# Patient Record
Sex: Male | Born: 1965 | Race: Black or African American | Hispanic: No | Marital: Married | Smoking: Never smoker
Health system: Southern US, Community
[De-identification: ages and names within clinical notes are randomized; demographics above are authoritative.]

## PROBLEM LIST (undated history)

## (undated) DIAGNOSIS — K219 Gastro-esophageal reflux disease without esophagitis: Secondary | ICD-10-CM

## (undated) DIAGNOSIS — F32A Depression, unspecified: Secondary | ICD-10-CM

## (undated) DIAGNOSIS — I10 Essential (primary) hypertension: Secondary | ICD-10-CM

## (undated) DIAGNOSIS — F329 Major depressive disorder, single episode, unspecified: Secondary | ICD-10-CM

## (undated) DIAGNOSIS — G4710 Hypersomnia, unspecified: Secondary | ICD-10-CM

## (undated) DIAGNOSIS — T7840XA Allergy, unspecified, initial encounter: Secondary | ICD-10-CM

## (undated) DIAGNOSIS — N411 Chronic prostatitis: Secondary | ICD-10-CM

## (undated) DIAGNOSIS — G4730 Sleep apnea, unspecified: Secondary | ICD-10-CM

## (undated) DIAGNOSIS — L309 Dermatitis, unspecified: Secondary | ICD-10-CM

## (undated) DIAGNOSIS — N529 Male erectile dysfunction, unspecified: Secondary | ICD-10-CM

## (undated) DIAGNOSIS — K5790 Diverticulosis of intestine, part unspecified, without perforation or abscess without bleeding: Secondary | ICD-10-CM

## (undated) HISTORY — DX: Chronic prostatitis: N41.1

## (undated) HISTORY — DX: Depression, unspecified: F32.A

## (undated) HISTORY — DX: Male erectile dysfunction, unspecified: N52.9

## (undated) HISTORY — DX: Diverticulosis of intestine, part unspecified, without perforation or abscess without bleeding: K57.90

## (undated) HISTORY — DX: Gastro-esophageal reflux disease without esophagitis: K21.9

## (undated) HISTORY — DX: Allergy, unspecified, initial encounter: T78.40XA

## (undated) HISTORY — DX: Major depressive disorder, single episode, unspecified: F32.9

## (undated) HISTORY — DX: Sleep apnea, unspecified: G47.30

## (undated) HISTORY — DX: Essential (primary) hypertension: I10

## (undated) HISTORY — DX: Dermatitis, unspecified: L30.9

## (undated) HISTORY — DX: Hypersomnia, unspecified: G47.10

## (undated) HISTORY — PX: VASECTOMY: SHX75

---

## 1998-06-21 ENCOUNTER — Ambulatory Visit (HOSPITAL_COMMUNITY): Admission: RE | Admit: 1998-06-21 | Discharge: 1998-06-21 | Payer: Self-pay | Admitting: Family Medicine

## 1998-06-21 ENCOUNTER — Encounter: Payer: Self-pay | Admitting: Family Medicine

## 1998-08-04 ENCOUNTER — Encounter: Payer: Self-pay | Admitting: Family Medicine

## 1998-08-04 ENCOUNTER — Ambulatory Visit (HOSPITAL_COMMUNITY): Admission: RE | Admit: 1998-08-04 | Discharge: 1998-08-04 | Payer: Self-pay | Admitting: Family Medicine

## 1998-08-06 ENCOUNTER — Emergency Department (HOSPITAL_COMMUNITY): Admission: EM | Admit: 1998-08-06 | Discharge: 1998-08-06 | Payer: Self-pay | Admitting: Emergency Medicine

## 2000-05-06 ENCOUNTER — Inpatient Hospital Stay (HOSPITAL_COMMUNITY): Admission: AD | Admit: 2000-05-06 | Discharge: 2000-05-08 | Payer: Self-pay | Admitting: Family Medicine

## 2000-05-08 ENCOUNTER — Encounter: Payer: Self-pay | Admitting: Family Medicine

## 2001-12-30 ENCOUNTER — Ambulatory Visit (HOSPITAL_COMMUNITY): Admission: RE | Admit: 2001-12-30 | Discharge: 2001-12-30 | Payer: Self-pay | Admitting: *Deleted

## 2001-12-30 ENCOUNTER — Encounter: Payer: Self-pay | Admitting: *Deleted

## 2002-02-28 ENCOUNTER — Encounter: Payer: Self-pay | Admitting: Family Medicine

## 2002-02-28 ENCOUNTER — Ambulatory Visit: Admission: RE | Admit: 2002-02-28 | Discharge: 2002-02-28 | Payer: Self-pay | Admitting: Emergency Medicine

## 2002-09-08 HISTORY — PX: CIRCUMCISION: SUR203

## 2003-01-06 ENCOUNTER — Ambulatory Visit (HOSPITAL_BASED_OUTPATIENT_CLINIC_OR_DEPARTMENT_OTHER): Admission: RE | Admit: 2003-01-06 | Discharge: 2003-01-06 | Payer: Self-pay | Admitting: Urology

## 2003-05-08 ENCOUNTER — Ambulatory Visit (HOSPITAL_BASED_OUTPATIENT_CLINIC_OR_DEPARTMENT_OTHER): Admission: RE | Admit: 2003-05-08 | Discharge: 2003-05-08 | Payer: Self-pay | Admitting: Internal Medicine

## 2004-04-24 ENCOUNTER — Emergency Department (HOSPITAL_COMMUNITY): Admission: EM | Admit: 2004-04-24 | Discharge: 2004-04-24 | Payer: Self-pay | Admitting: Emergency Medicine

## 2004-05-15 ENCOUNTER — Ambulatory Visit (HOSPITAL_COMMUNITY): Admission: RE | Admit: 2004-05-15 | Discharge: 2004-05-15 | Payer: Self-pay | Admitting: Family Medicine

## 2004-11-25 ENCOUNTER — Observation Stay (HOSPITAL_COMMUNITY): Admission: RE | Admit: 2004-11-25 | Discharge: 2004-11-25 | Payer: Self-pay | Admitting: Surgery

## 2004-11-25 ENCOUNTER — Encounter (INDEPENDENT_AMBULATORY_CARE_PROVIDER_SITE_OTHER): Payer: Self-pay | Admitting: *Deleted

## 2005-07-09 ENCOUNTER — Encounter: Admission: RE | Admit: 2005-07-09 | Discharge: 2005-07-09 | Payer: Self-pay | Admitting: *Deleted

## 2009-08-22 ENCOUNTER — Encounter: Admission: RE | Admit: 2009-08-22 | Discharge: 2009-08-22 | Payer: Self-pay | Admitting: Family Medicine

## 2009-09-08 HISTORY — PX: CERVICAL FUSION: SHX112

## 2009-09-10 ENCOUNTER — Encounter: Admission: RE | Admit: 2009-09-10 | Discharge: 2009-09-10 | Payer: Self-pay | Admitting: Family Medicine

## 2009-09-17 ENCOUNTER — Ambulatory Visit (HOSPITAL_COMMUNITY): Admission: RE | Admit: 2009-09-17 | Discharge: 2009-09-18 | Payer: Self-pay | Admitting: Neurological Surgery

## 2009-10-22 ENCOUNTER — Encounter: Admission: RE | Admit: 2009-10-22 | Discharge: 2009-10-22 | Payer: Self-pay | Admitting: Neurological Surgery

## 2010-11-23 LAB — DIFFERENTIAL
Basophils Absolute: 0 10*3/uL (ref 0.0–0.1)
Basophils Relative: 0 % (ref 0–1)
Eosinophils Absolute: 0.1 10*3/uL (ref 0.0–0.7)
Eosinophils Relative: 2 % (ref 0–5)
Lymphocytes Relative: 33 % (ref 12–46)
Lymphs Abs: 2 10*3/uL (ref 0.7–4.0)
Monocytes Absolute: 0.5 10*3/uL (ref 0.1–1.0)
Monocytes Relative: 8 % (ref 3–12)
Neutro Abs: 3.5 10*3/uL (ref 1.7–7.7)
Neutrophils Relative %: 56 % (ref 43–77)

## 2010-11-23 LAB — BASIC METABOLIC PANEL
BUN: 12 mg/dL (ref 6–23)
CO2: 27 mEq/L (ref 19–32)
Calcium: 9.6 mg/dL (ref 8.4–10.5)
Chloride: 107 mEq/L (ref 96–112)
Creatinine, Ser: 1.07 mg/dL (ref 0.4–1.5)
GFR calc Af Amer: >60 mL/min (ref >60)
GFR calc non Af Amer: >60 mL/min (ref >60)
Glucose, Bld: 100 mg/dL — ABNORMAL HIGH (ref 70–99)
Potassium: 4 mEq/L (ref 3.5–5.1)
Sodium: 142 mEq/L (ref 135–145)

## 2010-11-23 LAB — PROTIME-INR
INR: 1.04 (ref 0.00–1.49)
Prothrombin Time: 13.5 seconds (ref 11.6–15.2)

## 2010-11-23 LAB — CBC
HCT: 46.2 % (ref 39.0–52.0)
Hemoglobin: 16.1 g/dL (ref 13.0–17.0)
MCHC: 35 g/dL (ref 30.0–36.0)
MCV: 88.4 fL (ref 78.0–100.0)
Platelets: 190 10*3/uL (ref 150–400)
RBC: 5.22 MIL/uL (ref 4.22–5.81)
RDW: 13 % (ref 11.5–15.5)
WBC: 6.2 10*3/uL (ref 4.0–10.5)

## 2010-11-23 LAB — APTT: aPTT: 27 seconds (ref 24–37)

## 2011-01-24 NOTE — Op Note (Signed)
NAME:  Ronnie Wood, Ronnie Wood                ACCOUNT NO.:  [id number]   MEDICAL RECORD NO.:  [id number]          PATIENT TYPE:  AMB   LOCATION:  DAY                          FACILITY:  [facility]   PHYSICIAN:  Sandria Bales. Ezzard Standing, M.D.  DATE OF BIRTH:  [birth year]   DATE OF PROCEDURE:  11/25/2004  DATE OF DISCHARGE:                                 OPERATIVE REPORT   PREOPERATIVE DIAGNOSIS:  Third-degree prolapsing hemorrhoids, mainly on the  right anterior and posterior bundles.   POSTOPERATIVE DIAGNOSIS:  Third-degree prolapsing hemorrhoids, sigmoid  diverticulosis.   PROCEDURE:  Flexible sigmoidoscopy to 60 cm and PPH stapling with 33-mm  Ethicon PPH stapler.   SURGEON:  Sandria Bales. Ezzard Standing, M.D.   FIRST ASSISTANT:  Vikki Ports, MD   ANESTHESIA:  General endotracheal in the prone position.   COMPLICATIONS:  None.   DESCRIPTION OF PROCEDURE:  Ronnie Wood is a 45 year old black male who I have  seen over four years for hemorrhoidal disease which has not gotten better  with medical management.  He now comes for attempted PPH stapling.  He has  loose stools which almost sounds like kind of an irritable bowel syndrome  problem.   The indications and potential complications of PPH stapling have been  explained to the patient.  The potential complications include, but are not  limited to, bleeding, infection, recurrence of the hemorrhoids, and  stricture formation.   He completed a Half-Lytely bowel prep at home.   Operative Note:  The patient was placed in a prone jackknife position with his buttocks taped  apart.  I passed a flexible Olympus sigmoidoscope to 60 cm.  He has fairly  significant sigmoid colon diverticulosis.  I do not see any masses, polyps,  or mucosal lesions.   The scope is withdrawn.  During the bowel prep, which the patient had  prepped his bowel with HalfLytely, he did have some edema of his hemorrhoids  today.  I injected the hemorrhoidal tissue with Wydase and  then used 30 cc  of 0.5% Marcaine with epinephrine in a circumanal fashion as a block.   I then used the silver anoscope and inserted this into the rectum.  I placed  a pursestring 2-0 Prolene suture in a circumferential pattern at about 5 cm  above the dentate line.  I then inserted the PPH stapler through this, tied  this down with three throws, and I then cinched down the [facility] stapler until  it was closed and the 4-cm mark was at the anal verge.   I then held pressure for one minute, fired the stapler, and removed the  stapler and the anal dilator.   I had a good 2+-cm ring of hemorrhoidal tissue in my staple.  I then  inspected the anus with the smaller anoscope in all four quadrants of the  rectum, and there was no new bleeding at any staple line, and we had good  retraction of his hemorrhoidal tissue.  The staple line is at 2 cm above the  dentate line.   I then took Nupercaine ointment-covered Gelfoam and packed  this into his  rectum.  I then sterilely dressed him.   We will keep him here for six to eight hours.  If he does well, he will go  home; otherwise, we will keep him overnight.  The patient tolerated the  procedure well.  He will need to be on some diet for his diverticulosis, and  I will discuss this with him when I see him back in the office.      DHN/MEDQ  D:  11/25/2004  T:  11/25/2004  Job:  [id number]   cc:   Deatra James, M.D.  Fax: [phone]

## 2011-01-24 NOTE — Op Note (Signed)
   NAME:  Ronnie Wood, Ronnie Wood                          ACCOUNT NO.:  [id number]   MEDICAL RECORD NO.:  [id number]                   PATIENT TYPE:  AMB   LOCATION:  NESC                                 FACILITY:  [facility]   PHYSICIAN:  Mark C. Vernie Ammons, M.D.               DATE OF BIRTH:  Feb 03, 1966   DATE OF PROCEDURE:  01/06/2003  DATE OF DISCHARGE:                                 OPERATIVE REPORT   PREOPERATIVE DIAGNOSIS:  Tethered frenulum and redundant foreskin.   POSTOPERATIVE DIAGNOSIS:  Tethered frenulum and redundant foreskin.   PROCEDURE:  Circumcision and release of tethered frenulum.   SURGEON:  Mark C. Vernie Ammons, M.D.   ANESTHESIA:  MAC with local.   ESTIMATED BLOOD LOSS:  Less than 5 mL.   SPECIMENS:  None.   DRAINS:  None.   COMPLICATIONS:  None.   INDICATIONS FOR PROCEDURE:  The patient is a 45 year old black male who has  been having discomfort when he has an erection and during intercourse due to  a band of tissue he describes at the bottom of the penis near the head. I  found on exam that he had a tethering frenulum. He would like that repaired  and understands the risks, complications and alternatives.   DESCRIPTION OF PROCEDURE:  After informed consent, the patient was brought  to the major OR, placed on the table, administered intravenous sedation. I  then performed a dorsal penile block in the standard fashion. Using a  combination 50% of 0.25% and 50% of 0.5% Marcaine. I then made a  circumcising incision circumferentially just proximal to the glans. At the  area of the frenulum, I noted the band of tissue causing his tethered  frenulum. This was incised and it released the frenulum. The redundant  foreskin was then excised by making a second incision on the shaft and  excising the redundant tissue. Bleeding points were cauterized. The frenulum  was then reapproximated in the midline with interrupted 3-0 Chromic sutures.  I then reapproximated the skin edges  with running 3-0 chromic. Neosporin and  a sterile occlusive dressing were applied. The patient was awakened and  taken to the recovery room in stable satisfactory condition. He will be  given a prescription for 40 Tylox and followup in my office in three weeks.                                                Mark C. Vernie Ammons, M.D.    MCO/MEDQ  D:  01/06/2003  T:  01/06/2003  Job:  [id number]
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
## 2012-12-27 ENCOUNTER — Other Ambulatory Visit: Payer: Self-pay | Admitting: Family Medicine

## 2013-05-13 ENCOUNTER — Ambulatory Visit (INDEPENDENT_AMBULATORY_CARE_PROVIDER_SITE_OTHER): Payer: BC Managed Care – PPO | Admitting: Physician Assistant

## 2013-05-13 VITALS — BP 105/60 | HR 54 | Temp 98.4°F | Resp 16 | Ht 67.5 in | Wt 173.0 lb

## 2013-05-13 DIAGNOSIS — J309 Allergic rhinitis, unspecified: Secondary | ICD-10-CM

## 2013-05-13 DIAGNOSIS — Z0000 Encounter for general adult medical examination without abnormal findings: Secondary | ICD-10-CM

## 2013-05-13 DIAGNOSIS — I10 Essential (primary) hypertension: Secondary | ICD-10-CM

## 2013-05-13 DIAGNOSIS — Z125 Encounter for screening for malignant neoplasm of prostate: Secondary | ICD-10-CM

## 2013-05-13 DIAGNOSIS — R209 Unspecified disturbances of skin sensation: Secondary | ICD-10-CM

## 2013-05-13 DIAGNOSIS — G4733 Obstructive sleep apnea (adult) (pediatric): Secondary | ICD-10-CM

## 2013-05-13 DIAGNOSIS — Z1211 Encounter for screening for malignant neoplasm of colon: Secondary | ICD-10-CM

## 2013-05-13 DIAGNOSIS — Z23 Encounter for immunization: Secondary | ICD-10-CM

## 2013-05-13 DIAGNOSIS — R202 Paresthesia of skin: Secondary | ICD-10-CM

## 2013-05-13 LAB — POCT URINALYSIS DIPSTICK
Bilirubin, UA: NEGATIVE
Glucose, UA: NEGATIVE
Ketones, UA: NEGATIVE
Leukocytes, UA: NEGATIVE
Nitrite, UA: NEGATIVE
Protein, UA: NEGATIVE
Spec Grav, UA: 1.025
Urobilinogen, UA: 0.2
pH, UA: 5.5

## 2013-05-13 LAB — COMPREHENSIVE METABOLIC PANEL
ALT: 21 U/L (ref 0–53)
AST: 23 U/L (ref 0–37)
Albumin: 4.4 g/dL (ref 3.5–5.2)
Alkaline Phosphatase: 47 U/L (ref 39–117)
BUN: 17 mg/dL (ref 6–23)
CO2: 30 mEq/L (ref 19–32)
Calcium: 9.6 mg/dL (ref 8.4–10.5)
Chloride: 101 mEq/L (ref 96–112)
Creat: 1.02 mg/dL (ref 0.50–1.35)
Glucose, Bld: 86 mg/dL (ref 70–99)
Potassium: 4 mEq/L (ref 3.5–5.3)
Sodium: 138 mEq/L (ref 135–145)
Total Bilirubin: 0.7 mg/dL (ref 0.3–1.2)
Total Protein: 7.3 g/dL (ref 6.0–8.3)

## 2013-05-13 LAB — POCT CBC
Granulocyte percent: 56 %G (ref 37–80)
HCT, POC: 46.2 % (ref 43.5–53.7)
Hemoglobin: 14.9 g/dL (ref 14.1–18.1)
Lymph, poc: 2.2 (ref 0.6–3.4)
MCH, POC: 30 pg (ref 27–31.2)
MCHC: 32.3 g/dL (ref 31.8–35.4)
MCV: 92.9 fL (ref 80–97)
MID (cbc): 0.5 (ref 0–0.9)
MPV: 8.6 fL (ref 0–99.8)
POC Granulocyte: 3.4 (ref 2–6.9)
POC LYMPH PERCENT: 36.3 %L (ref 10–50)
POC MID %: 7.7 %M (ref 0–12)
Platelet Count, POC: 211 10*3/uL (ref 142–424)
RBC: 4.97 M/uL (ref 4.69–6.13)
RDW, POC: 13.7 %
WBC: 6.1 10*3/uL (ref 4.6–10.2)

## 2013-05-13 LAB — POCT UA - MICROSCOPIC ONLY
Bacteria, U Microscopic: NEGATIVE
Casts, Ur, LPF, POC: NEGATIVE
Crystals, Ur, HPF, POC: NEGATIVE
Epithelial cells, urine per micros: NEGATIVE
Mucus, UA: NEGATIVE
RBC, urine, microscopic: NEGATIVE
WBC, Ur, HPF, POC: NEGATIVE
Yeast, UA: NEGATIVE

## 2013-05-13 LAB — LIPID PANEL
Cholesterol: 195 mg/dL (ref 0–200)
HDL: 34 mg/dL — ABNORMAL LOW (ref >39)
LDL Cholesterol: 120 mg/dL — ABNORMAL HIGH (ref 0–99)
Total CHOL/HDL Ratio: 5.7 Ratio
Triglycerides: 205 mg/dL — ABNORMAL HIGH (ref <150)
VLDL: 41 mg/dL — ABNORMAL HIGH (ref 0–40)

## 2013-05-13 LAB — TSH: TSH: 2.098 u[IU]/mL (ref 0.350–4.500)

## 2013-05-13 LAB — IFOBT (OCCULT BLOOD): IFOBT: NEGATIVE

## 2013-05-13 LAB — PSA: PSA: 1.79 ng/mL (ref <=4.00)

## 2013-05-13 NOTE — Progress Notes (Signed)
Subjective:    Patient ID: Ronnie Wood, male    DOB: 1966-02-28, 47 y.o.   MRN: [id number]  HPI This 47 y.o. male presents for Annual Wellness Exam.  PCP is Dr. Deatra James at [facility].  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  PMH: HTN, allergic rhinitis. S/P vasectomy, circumcision, fusion of C3-4 and C5-6.  MEDS: amlodipine 10 mg daily, fosinopril 10 mg daily  ALL: Sulfa antibiotics  History   Social History  . Marital Status: Single    Spouse Name: n/a    Number of Children: 2  . Years of Education: college   Occupational History  . 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 basketball official    Social History Main Topics  . Smoking status: Never Smoker   . Smokeless tobacco: Never Used  . Alcohol Use: 2.5 oz/week    5 drink(s) per week  . Drug Use: No  . Sexual Activity: Yes    Partners: Female    Birth Control/ Protection: Condom   Social History Narrative   Lives alone.  Divorced.  His older daughter graduated from [employer] of Therapist, sports and is currently Futures trader at [employer]. His younger daughter is a Biochemist, clinical and track star (200 m, 400 m) at [employer]. The patient runs about 30 minutes 4 days/week.       Review of Systems  Constitutional: Positive for fatigue (due to his work (3rd shift) and basketball officiating; feels well rested upon waking ). Negative for fever, chills, diaphoresis, activity change, appetite change and unexpected weight change.  HENT: Positive for congestion, neck stiffness ("sometimes, when I sleep funny") and sinus pressure. Negative for hearing loss, ear pain, nosebleeds, sore throat, facial swelling, rhinorrhea, sneezing, drooling, mouth sores, trouble swallowing, neck pain, dental problem, voice change, postnasal drip, tinnitus and ear discharge.   Eyes: Positive for itching. Negative for photophobia, pain, discharge, redness and visual  disturbance.  Respiratory: Positive for apnea (OSA, uses CPAP). Negative for cough, choking, chest tightness, shortness of breath, wheezing and stridor.   Cardiovascular: Negative.   Gastrointestinal: Positive for constipation (when doesn't drink enough water during the day). Negative for nausea, vomiting, abdominal pain, diarrhea, blood in stool, abdominal distention, anal bleeding and rectal pain.  Endocrine: Negative.   Genitourinary: Negative.   Musculoskeletal: Negative for myalgias, back pain, joint swelling, arthralgias and gait problem.  Skin: Negative.   Allergic/Immunologic: Positive for environmental allergies. Negative for food allergies and immunocompromised state.  Neurological: Positive for numbness (bilateral index and middle fingertips, R>L). Negative for dizziness, tremors, seizures, syncope, facial asymmetry, speech difficulty, weakness, light-headedness and headaches.  Hematological: Negative.   Psychiatric/Behavioral: Negative.        Objective:   Physical Exam  Vitals reviewed. Constitutional: He is oriented to person, place, and time. Vital signs are normal. He appears well-developed and well-nourished. He is active and cooperative.  Non-toxic appearance. He does not have a sickly appearance. He does not appear ill. No distress.  HENT:  Head: Normocephalic and atraumatic.  Right Ear: Hearing, tympanic membrane, external ear and ear canal normal.  Left Ear: Hearing, tympanic membrane, external ear and ear canal normal.  Nose: Nose normal.  Mouth/Throat: Uvula is midline, oropharynx is clear and moist and mucous membranes are normal. He does not have dentures. No oral lesions. No trismus in the jaw. Normal dentition. No dental abscesses, edematous, lacerations or dental caries.  Eyes:  Conjunctivae, EOM and lids are normal. Pupils are equal, round, and reactive to light. Right eye exhibits no discharge. Left eye exhibits no discharge. No scleral icterus.  Fundoscopic  exam:      The right eye shows no arteriolar narrowing, no AV nicking, no exudate, no hemorrhage and no papilledema.       The left eye shows no arteriolar narrowing, no AV nicking, no exudate, no hemorrhage and no papilledema.  Neck: Normal range of motion, full passive range of motion without pain and phonation normal. Neck supple. No spinous process tenderness and no muscular tenderness present. No rigidity. No tracheal deviation, no edema, no erythema and normal range of motion present. No thyromegaly present.  Cardiovascular: Normal rate, regular rhythm, S1 normal, S2 normal, normal heart sounds, intact distal pulses and normal pulses.  Exam reveals no gallop and no friction rub.   No murmur heard. Pulmonary/Chest: Effort normal and breath sounds normal. No respiratory distress. He has no wheezes. He has no rales.  Abdominal: Soft. Normal appearance and bowel sounds are normal. He exhibits no distension and no mass. There is no hepatosplenomegaly. There is no tenderness. There is no rebound and no guarding. No hernia. Hernia confirmed negative in the right inguinal area and confirmed negative in the left inguinal area.  Genitourinary: Rectum normal, prostate normal, testes normal and penis normal. Guaiac negative stool. Circumcised. No phimosis, paraphimosis, hypospadias, penile erythema or penile tenderness. No discharge found.  Hypopigmentation of the glans penis  Musculoskeletal: Normal range of motion. He exhibits no edema and no tenderness.       Right shoulder: Normal.       Left shoulder: Normal.       Right elbow: Normal.      Left elbow: Normal.       Right wrist: Normal.       Left wrist: Normal.       Right hip: Normal.       Left hip: Normal.       Right knee: Normal.       Left knee: Normal.       Right ankle: Normal. Achilles tendon normal.       Left ankle: Normal. Achilles tendon normal.       Cervical back: Normal. He exhibits normal range of motion, no tenderness, no  bony tenderness, no swelling, no edema, no deformity, no laceration, no pain, no spasm and normal pulse.       Thoracic back: Normal.       Lumbar back: Normal.       Right upper arm: Normal.       Left upper arm: Normal.       Right forearm: Normal.       Left forearm: Normal.       Right hand: Normal.       Left hand: Normal.       Right upper leg: Normal.       Left upper leg: Normal.       Right lower leg: Normal.       Left lower leg: Normal.       Right foot: Normal.       Left foot: Normal.  Lymphadenopathy:       Head (right side): No submental, no submandibular, no tonsillar, no preauricular, no posterior auricular and no occipital adenopathy present.       Head (left side): No submental, no submandibular, no tonsillar, no preauricular, no posterior auricular and no occipital adenopathy  present.    He has no cervical adenopathy.       Right: No inguinal and no supraclavicular adenopathy present.       Left: No inguinal and no supraclavicular adenopathy present.  Neurological: He is alert and oriented to person, place, and time. He has normal strength and normal reflexes. He displays no tremor. No cranial nerve deficit or sensory deficit. He exhibits normal muscle tone. Coordination and gait normal.  Tingling of the index and middle fingers worsened by Phalen's, not Tinel's.  Skin: Skin is warm, dry and intact. No abrasion, no ecchymosis, no laceration, no lesion and no rash noted. He is not diaphoretic. No cyanosis or erythema. No pallor. Nails show no clubbing.  Psychiatric: He has a normal mood and affect. His speech is normal and behavior is normal. Judgment and thought content normal. Cognition and memory are normal.      Results for orders placed in visit on 05/13/13  IFOBT (OCCULT BLOOD)      Result Value Range   IFOBT Negative    POCT CBC      Result Value Range   WBC 6.1  4.6 - 10.2 K/uL   Lymph, poc 2.2  0.6 - 3.4   POC LYMPH PERCENT 36.3  10 - 50 %L   MID  (cbc) 0.5  0 - 0.9   POC MID % 7.7  0 - 12 %M   POC Granulocyte 3.4  2 - 6.9   Granulocyte percent 56.0  37 - 80 %G   RBC 4.97  4.69 - 6.13 M/uL   Hemoglobin 14.9  14.1 - 18.1 g/dL   HCT, POC 91.4  78.2 - 53.7 %   MCV 92.9  80 - 97 fL   MCH, POC 30.0  27 - 31.2 pg   MCHC 32.3  31.8 - 35.4 g/dL   RDW, POC 95.6     Platelet Count, POC 211  142 - 424 K/uL   MPV 8.6  0 - 99.8 fL  POCT UA - MICROSCOPIC ONLY      Result Value Range   WBC, Ur, HPF, POC neg     RBC, urine, microscopic neg     Bacteria, U Microscopic neg     Mucus, UA neg     Epithelial cells, urine per micros neg     Crystals, Ur, HPF, POC neg     Casts, Ur, LPF, POC neg     Yeast, UA neg    POCT URINALYSIS DIPSTICK      Result Value Range   Color, UA yellow     Clarity, UA clear     Glucose, UA neg     Bilirubin, UA neg     Ketones, UA neg     Spec Grav, UA 1.025     Blood, UA trace-intact     pH, UA 5.5     Protein, UA neg     Urobilinogen, UA 0.2     Nitrite, UA neg     Leukocytes, UA Negative         Assessment & Plan:  Routine general medical examination at a health care facility - Age appropriate anticipatory guidance provided.  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  Trial with bilateral wrist splints.  F/U here or with PCP in 4-6 weeks.  If no improvement, consider NCS.  Allergic rhinitis - not interested in treatment at this time.  OSA (obstructive sleep apnea) - continue CPAP  Screening for prostate cancer - Plan: PSA  Screening for colon cancer - Plan: IFOBT POC (occult bld, rslt in office)  Need for influenza vaccination - Plan: Flu Vaccine QUAD 36+ mos IM  Fernande Bras, PA-C Physician Assistant-Certified [facility]

## 2013-05-13 NOTE — Patient Instructions (Addendum)
I will contact you with your lab results as soon as they are available.   If you have not heard from me in 2 weeks, please contact me.  The fastest way to get your results is to register for My Chart (see the instructions on the last page of this printout).  Wear the wrist splints as much as you can.  If they interfere at work, you can just wear them when you sleep. Follow-up here, or with Dr. Wynelle Link, in 4-6 weeks if the numbness/tingling in your fingers persists. You may need nerve conduction studies to determine the source of the problem.  Keeping you healthy  Get these tests  Blood pressure- Have your blood pressure checked once a year by your healthcare provider.  Normal blood pressure is 120/80.  Weight- Have your body mass index (BMI) calculated to screen for obesity.  BMI is a measure of body fat based on height and weight. You can also calculate your own BMI at https://www.west-esparza.com/.  Cholesterol- Have your cholesterol checked regularly starting at age 80, sooner may be necessary if you have diabetes, high blood pressure, if a family member developed heart diseases at an early age or if you smoke.   Chlamydia, HIV, and other sexual transmitted disease- Get screened each year until the age of 62 then within three months of each new sexual partner.  Diabetes- Have your blood sugar checked regularly if you have high blood pressure, high cholesterol, a family history of diabetes or if you are overweight.  Get these vaccines  Flu shot- Every fall.  Tetanus shot- Every 10 years.  Menactra- Single dose; prevents meningitis.  Take these steps  Don't smoke- If you do smoke, ask your healthcare provider about quitting. For tips on how to quit, go to www.smokefree.gov or call 1-800-QUIT-NOW.  Be physically active- Exercise 5 days a week for at least 30 minutes.  If you are not already physically active start slow and gradually work up to 30 minutes of moderate physical activity.   Examples of moderate activity include walking briskly, mowing the yard, dancing, swimming bicycling, etc.  Eat a healthy diet- Eat a variety of healthy foods such as fruits, vegetables, low fat milk, low fat cheese, yogurt, lean meats, poultry, fish, beans, tofu, etc.  For more information on healthy eating, go to www.thenutritionsource.org  Drink alcohol in moderation- Limit alcohol intake two drinks or less a day.  Never drink and drive.  Dentist- Brush and floss teeth twice daily; visit your dentis twice a year.  Depression-Your emotional health is as important as your physical health.  If you're feeling down, losing interest in things you normally enjoy please talk with your healthcare provider.  Gun Safety- If you keep a gun in your home, keep it unloaded and with the safety lock on.  Bullets should be stored separately.  Helmet use- Always wear a helmet when riding a motorcycle, bicycle, rollerblading or skateboarding.  Safe sex- If you may be exposed to a sexually transmitted infection, use a condom  Seat belts- Seat bels can save your life; always wear one.  Smoke/Carbon Monoxide detectors- These detectors need to be installed on the appropriate level of your home.  Replace batteries at least once a year.  Skin Cancer- When out in the sun, cover up and use sunscreen SPF 15 or higher.  Violence- If anyone is threatening or hurting you, please tell your healthcare provider.

## 2013-05-14 ENCOUNTER — Encounter: Payer: Self-pay | Admitting: Physician Assistant

## 2013-10-10 ENCOUNTER — Ambulatory Visit (INDEPENDENT_AMBULATORY_CARE_PROVIDER_SITE_OTHER): Payer: BC Managed Care – PPO | Admitting: Neurology

## 2013-10-10 ENCOUNTER — Telehealth: Payer: Self-pay | Admitting: *Deleted

## 2013-10-10 ENCOUNTER — Encounter: Payer: Self-pay | Admitting: Neurology

## 2013-10-10 VITALS — BP 120/78 | HR 68 | Temp 98.2°F | Ht 68.0 in | Wt 172.0 lb

## 2013-10-10 DIAGNOSIS — R42 Dizziness and giddiness: Secondary | ICD-10-CM

## 2013-10-10 DIAGNOSIS — H8110 Benign paroxysmal vertigo, unspecified ear: Secondary | ICD-10-CM

## 2013-10-10 NOTE — Telephone Encounter (Signed)
Patient is aware  appt for eeg at [facility]  10/14/13 at 10:45

## 2013-10-10 NOTE — Patient Instructions (Addendum)
At this point, I think the episodes may be due to low blood sugar when you don't eat.  To be sure, we will check an EEG of the brainwaves.  If it is abnormal, we will pursue other testing.  I do want to see you in 2 months to catch up and see if you continue to have any more spells.  In the meantime, remember to eat from now on. [facility] as soon as this is up and running per Misty StanleyLisa at scheduling

## 2013-10-10 NOTE — Progress Notes (Addendum)
NEUROLOGY CONSULTATION NOTE  Ronnie Wood MRN: [id number] DOB: [birth year]  Referring provider: Dr. Wynelle Link Primary care provider: Dr. Wynelle Link  Reason for consult:  Vertigo  HISTORY OF PRESENT ILLNESS: Ronnie Wood is a 49 year old right-handed man with history of hypertension, cervical fusion, GERD, hyperlipidemia, erectile dyfunction, mild OSA, recurrent prostatitis, and diverticulitis who presents for dizziness and nausea..  Records and images were personally reviewed where available.    Onset began about 3 years ago.  He describes a sensation of going to pass out and spinning sensation.  It is associated with nausea, cold sweat, blurred vision and feeling of going to have a bowel movement.  There is no headache, facial droop or focal numbness or weakness.  Episodes last 15 to 30 minutes.  They initially occurred once every 4 months, but reports 3 episodes over the past 6 months.  These episodes are associated with not having eaten for several hours (sometimes 16 hours).  He never had his blood glucose checked during an episode.  He is very active.  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  Eating something or laying down helps.  An episode occurred at work, where his blood pressure was reportedly normal.  He was worked up at urgent care with normal results.  He denies history of headache or migraine, however he reports a headache about 2 or 3 months ago where he had 7-8/10 nonthrobbing bi-retroorbital headache, not associated with other symptoms.  He also reports 5 month history of numbness in the finger.  No personal or family history of migraine, seizure or stroke.  09/13/13 Labs:  WBC 6.5, HGB 15.2, HCT 45.1, PLT 202, Na 139, K 4.2, Cl 104, BUN 16, Cr 1.11, glucose 91 08/19/12 Labs: B12 942, TSH 2.13  PAST MEDICAL HISTORY: Past Medical History  Diagnosis Date  . Allergy   . Hypertension   . Depression   . Diverticulosis     . Prostatitis, chronic   . Sleep apnea   . Erectile dysfunction   . GERD (gastroesophageal reflux disease)   . Eczema   . Hypersomnolence     PAST SURGICAL HISTORY: Past Surgical History  Procedure Laterality Date  . Vasectomy    . Circumcision  2004    resulted in hypopigmentation of the glans  . Cervical fusion  09/2009    C3-4, C5-6; Dr. Marikay Alar    MEDICATIONS: Current Outpatient Prescriptions on File Prior to Visit  Medication Sig Dispense Refill  . amLODipine (NORVASC) 10 MG tablet Take 20 mg by mouth daily.      . fosinopril (MONOPRIL) 10 MG tablet Take 10 mg by mouth daily.      . Multiple Vitamin (MULTIVITAMIN WITH MINERALS) TABS tablet Take 1 tablet by mouth daily.       No current facility-administered medications on file prior to visit.    ALLERGIES: Allergies  Allergen Reactions  . Sulfa Antibiotics     FAMILY HISTORY: Family History  Problem Relation Age of Onset  . Hypertension Mother   . Diabetes Sister   . Hypertension Brother   . Cancer - Colon Mother     SOCIAL HISTORY: History   Social History  . Marital Status: Single    Spouse Name: n/a    Number of Children: 2  . Years of Education: college   Occupational History  . 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  basketball official    Social History Main Topics  . Smoking status: Never Smoker   . Smokeless tobacco: Never Used  . Alcohol Use: 2.5 oz/week    5 drink(s) per week  . Drug Use: No  . Sexual Activity: Yes    Partners: Female    Birth Control/ Protection: Condom   Other Topics Concern  . Not on file   Social History Narrative   Lives alone.  Divorced.  His older daughter graduated from [employer] and is currently Futures traderstudying Human Biology at [employer]. His younger daughter is a Biochemist, clinicalcheerleader and track star (200 m, 400 m) at [employer]. The patient runs about 30 minutes 4 days/week.    REVIEW OF  SYSTEMS: Constitutional: No fevers, chills, or sweats, no generalized fatigue, change in appetite Eyes: No visual changes, double vision, eye pain Ear, nose and throat: No hearing loss, ear pain, nasal congestion, sore throat Cardiovascular: No chest pain, palpitations Respiratory:  No shortness of breath at rest or with exertion, wheezes GastrointestinaI: No nausea, vomiting, diarrhea, abdominal pain, fecal incontinence Genitourinary:  No dysuria, urinary retention or frequency Musculoskeletal:  No neck pain, back pain Integumentary: No rash, pruritus, skin lesions Neurological: as above Psychiatric: No depression, insomnia, anxiety Endocrine: No palpitations, fatigue, diaphoresis, mood swings, change in appetite, change in weight, increased thirst Hematologic/Lymphatic:  No anemia, purpura, petechiae. Allergic/Immunologic: no itchy/runny eyes, nasal congestion, recent allergic reactions, rashes  PHYSICAL EXAM: Filed Vitals:   10/10/13 0846  BP: 120/78  Pulse: 68  Temp: 98.2 F (36.8 C)   General: No acute distress Head:  Normocephalic/atraumatic Neck: supple, no paraspinal tenderness, full range of motion Back: No paraspinal tenderness Heart: regular rate and rhythm Lungs: Clear to auscultation bilaterally. Vascular: No carotid bruits. Neurological Exam: Mental status: alert and oriented to person, place, and time, speech fluent and not dysarthric, language intact. Cranial nerves: CN I: not tested CN II: pupils equal, round and reactive to light, visual fields intact, fundi unremarkable. CN III, IV, VI:  full range of motion, no nystagmus, no ptosis CN V: facial sensation intact CN VII: upper and lower face symmetric CN VIII: hearing intact CN IX, X: gag intact, uvula midline CN XI: sternocleidomastoid and trapezius muscles intact CN XII: tongue midline Bulk & Tone: normal, no fasciculations. Motor: 5/5 throughout Sensation: pinprick and vibration intact Deep Tendon  Reflexes: 2+ throughout, toes down Finger to nose testing: no dysmetria Heel to shin: no dysmetria Gait: normal stance and stride.  Able to turn, walk on toes, heels and in tandem. Romberg negative. Positive Tinel's sign bilaterally  IMPRESSION: Intermittent episodes of dizziness and vertigo.  Possibly related to hypoglycemia?  Other possibilities include migraine or seizure, although less likely. Probable bilateral carpal tunnel syndrome  PLAN: 1.  Do not miss meals 2.  Will check EEG.  If abnormal, further testing such as MRI of brain or starting an AED may be considered. 3.  Otherwise, follow up in 2 months to re-evaluate 4.  Regarding hand numbness, will contact us to pursue NCV-EMG if symptoms worsen.  45 minutes spent with patient, over 50% spent counseling and coordinating care.  Thank you for allowing me to take part in the care of this patient.  Shon MilletAdam Vlad Mayberry, DO  CC:   Deatra JamesVyvyan Sun, MD

## 2013-10-14 ENCOUNTER — Inpatient Hospital Stay (HOSPITAL_COMMUNITY): Admission: RE | Admit: 2013-10-14 | Payer: Self-pay | Source: Ambulatory Visit

## 2013-10-17 ENCOUNTER — Telehealth: Payer: Self-pay | Admitting: Neurology

## 2013-10-27 ENCOUNTER — Ambulatory Visit (HOSPITAL_COMMUNITY)
Admission: RE | Admit: 2013-10-27 | Discharge: 2013-10-27 | Disposition: A | Discharge status: Home / Self Care | Payer: BC Managed Care – PPO | Source: Ambulatory Visit | Attending: Neurology | Admitting: Neurology

## 2013-10-27 DIAGNOSIS — R55 Syncope and collapse: Secondary | ICD-10-CM | POA: Insufficient documentation

## 2013-10-27 DIAGNOSIS — R42 Dizziness and giddiness: Secondary | ICD-10-CM | POA: Insufficient documentation

## 2013-10-27 DIAGNOSIS — H8110 Benign paroxysmal vertigo, unspecified ear: Secondary | ICD-10-CM

## 2013-10-27 NOTE — Progress Notes (Signed)
EEG Completed; Results Pending  

## 2013-11-01 ENCOUNTER — Telehealth: Payer: Self-pay | Admitting: *Deleted

## 2013-11-01 NOTE — Procedures (Signed)
ELECTROENCEPHALOGRAM REPORT  Date of Study: 10/27/2013  Patient's Name: Ronnie SchlatterKeith L Chapa MRN: [id number] Date of Birth: [birth year]  Indication: Episodes of dizziness and presyncope  Medications: Amlodipine, fosinopril  Technical Summary: This is a multichannel digital EEG recording, using the international 10-20 placement system.  Spike detection software was employed.  Description: The EEG background is symmetric, with a well-developed posterior dominant rhythm of 9 Hz, which is reactive to eye opening and closing.  Diffuse beta activity is seen, with a bilateral frontal preponderance.  No focal or generalized abnormalities are seen.  No focal or generalized epileptiform discharges are seen.  Stage II sleep is seen, with normal and symmetric sleep patterns.  Hyperventilation and photic stimulation were performed, and produced no abnormalities.  ECG revealed normal cardiac rate and rhythm.  Impression: This is a normal routine EEG of the awake and asleep, with activating procedures.  A normal study does not rule out the possibility of a seizure disorder in this patient.  Elysha Daw R. Everlena CooperJaffe, DO

## 2013-11-01 NOTE — Telephone Encounter (Signed)
Patient is aware of normal test  

## 2013-11-01 NOTE — Telephone Encounter (Signed)
Patient is aware of normal EEG  

## 2013-11-02 ENCOUNTER — Ambulatory Visit
Admission: RE | Admit: 2013-11-02 | Discharge: 2013-11-02 | Disposition: A | Discharge status: Home / Self Care | Payer: BC Managed Care – PPO | Source: Ambulatory Visit | Attending: Family Medicine | Admitting: Family Medicine

## 2013-11-02 ENCOUNTER — Other Ambulatory Visit: Payer: Self-pay | Admitting: Family Medicine

## 2013-11-02 DIAGNOSIS — S86819A Strain of other muscle(s) and tendon(s) at lower leg level, unspecified leg, initial encounter: Principal | ICD-10-CM

## 2013-11-02 DIAGNOSIS — S838X9A Sprain of other specified parts of unspecified knee, initial encounter: Secondary | ICD-10-CM

## 2013-12-08 ENCOUNTER — Ambulatory Visit: Payer: Self-pay | Admitting: Neurology

## 2013-12-13 ENCOUNTER — Encounter: Payer: Self-pay | Admitting: Neurology

## 2013-12-13 ENCOUNTER — Telehealth: Payer: Self-pay | Admitting: Neurology

## 2013-12-13 NOTE — Telephone Encounter (Signed)
Pt no showed 12/08/13 follow up appt w/ Dr. Everlena CooperJaffe. No show letter mailed to pt / Sherri S.

## 2014-10-18 NOTE — Telephone Encounter (Signed)
error 

## 2014-12-26 ENCOUNTER — Other Ambulatory Visit: Payer: Self-pay | Admitting: Family Medicine

## 2014-12-26 DIAGNOSIS — K4090 Unilateral inguinal hernia, without obstruction or gangrene, not specified as recurrent: Secondary | ICD-10-CM

## 2014-12-27 ENCOUNTER — Ambulatory Visit
Admission: RE | Admit: 2014-12-27 | Discharge: 2014-12-27 | Disposition: A | Discharge status: Home / Self Care | Payer: BLUE CROSS/BLUE SHIELD | Source: Ambulatory Visit | Attending: Family Medicine | Admitting: Family Medicine

## 2014-12-27 DIAGNOSIS — K4090 Unilateral inguinal hernia, without obstruction or gangrene, not specified as recurrent: Secondary | ICD-10-CM

## 2017-10-14 ENCOUNTER — Other Ambulatory Visit: Payer: Self-pay | Admitting: Physician Assistant

## 2017-10-14 DIAGNOSIS — R1032 Left lower quadrant pain: Secondary | ICD-10-CM

## 2017-10-19 ENCOUNTER — Ambulatory Visit
Admission: RE | Admit: 2017-10-19 | Discharge: 2017-10-19 | Disposition: A | Discharge status: Home / Self Care | Payer: BLUE CROSS/BLUE SHIELD | Source: Ambulatory Visit | Attending: Physician Assistant | Admitting: Physician Assistant

## 2017-10-19 DIAGNOSIS — R1032 Left lower quadrant pain: Secondary | ICD-10-CM

## 2018-09-30 IMAGING — US US PELVIS LIMITED
1 series · 9 of 9 positions shown · non-contrast
Comparison: None.

CLINICAL DATA: 51-year-old male with left lower quadrant abdominal
pain for 2-3 weeks. Initial encounter.

EXAM:
US ABDOMEN LIMITED

[Series 1: us pelvis limited · 0.06mm/px · 9 acquisitions, 9 frames shown]
[im 1/9]
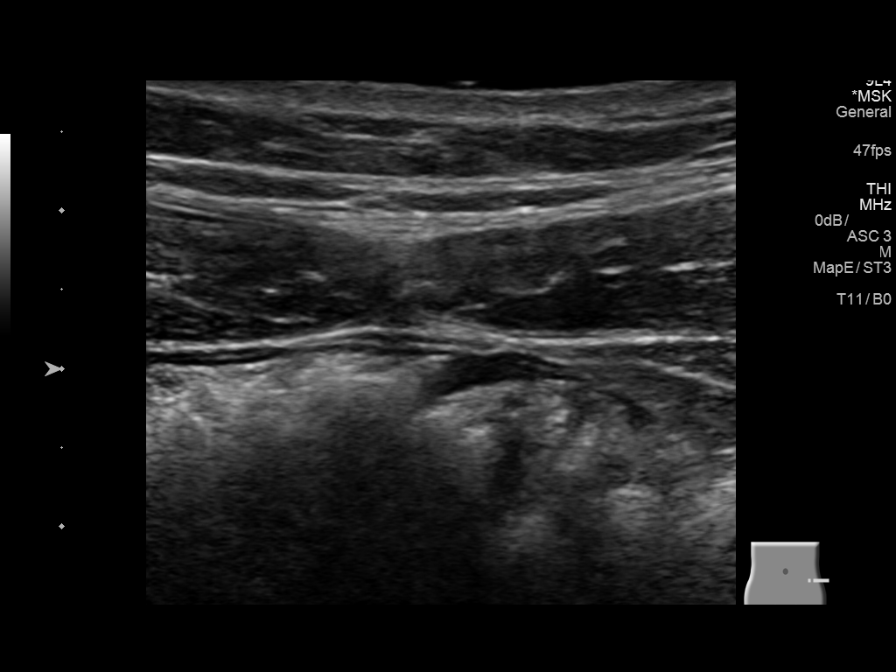
[im 2/9]
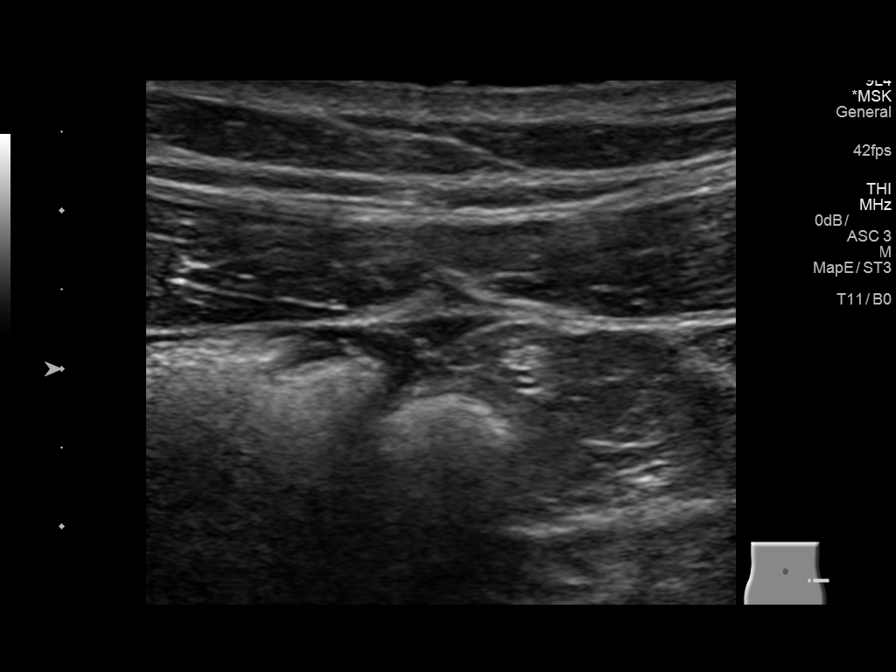
[im 3/9]
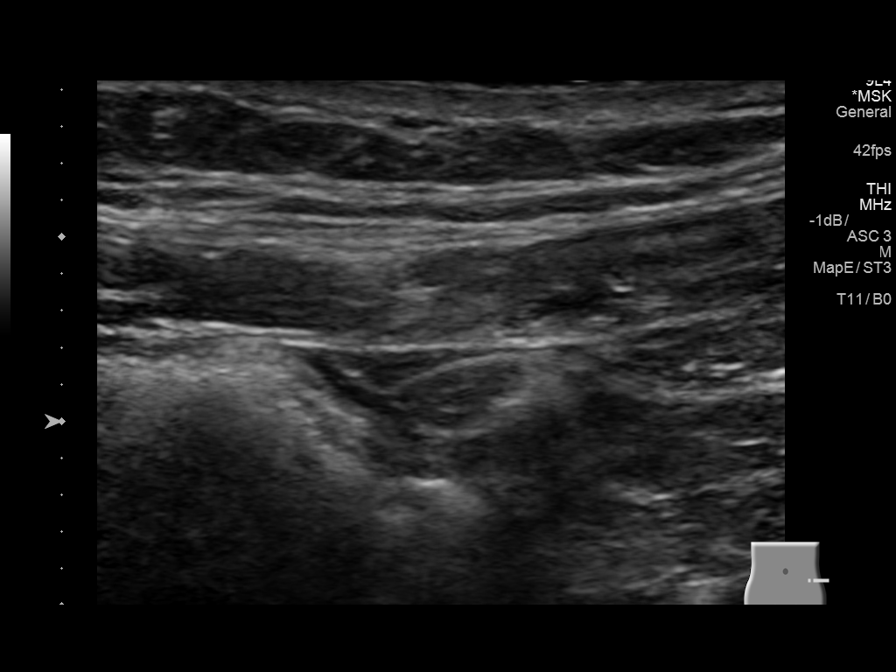
[im 4/9]
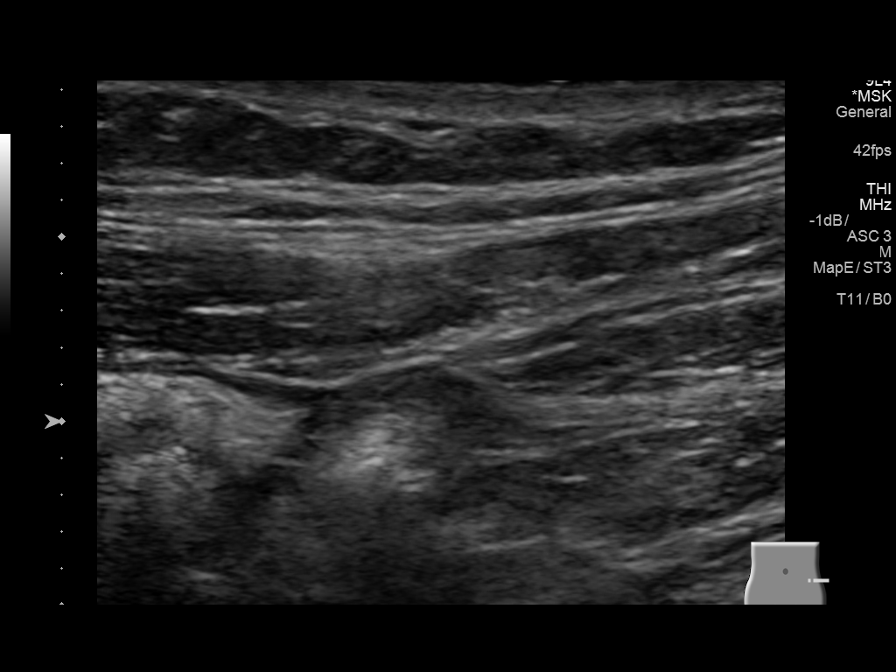
[im 5/9]
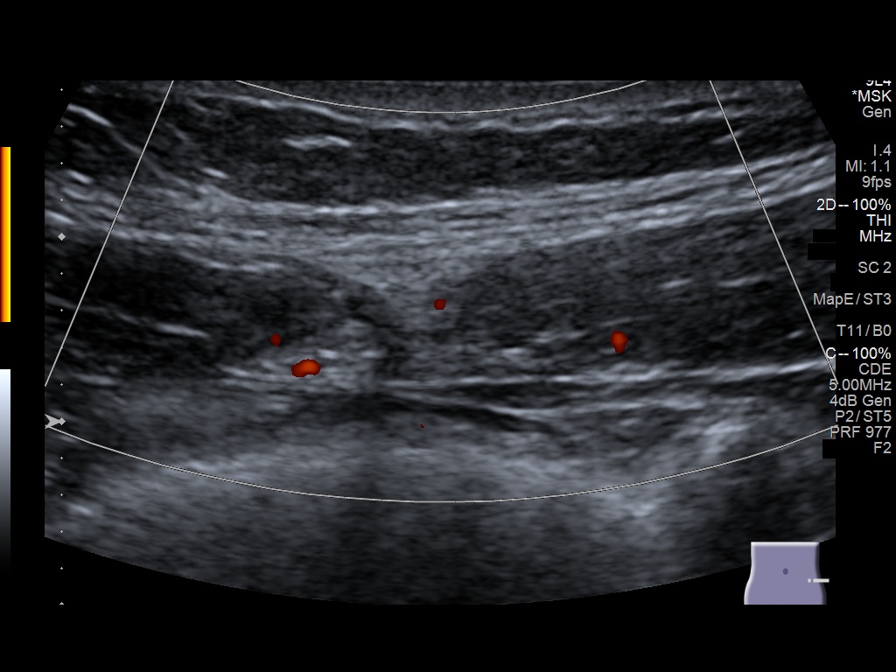
[im 6/9]
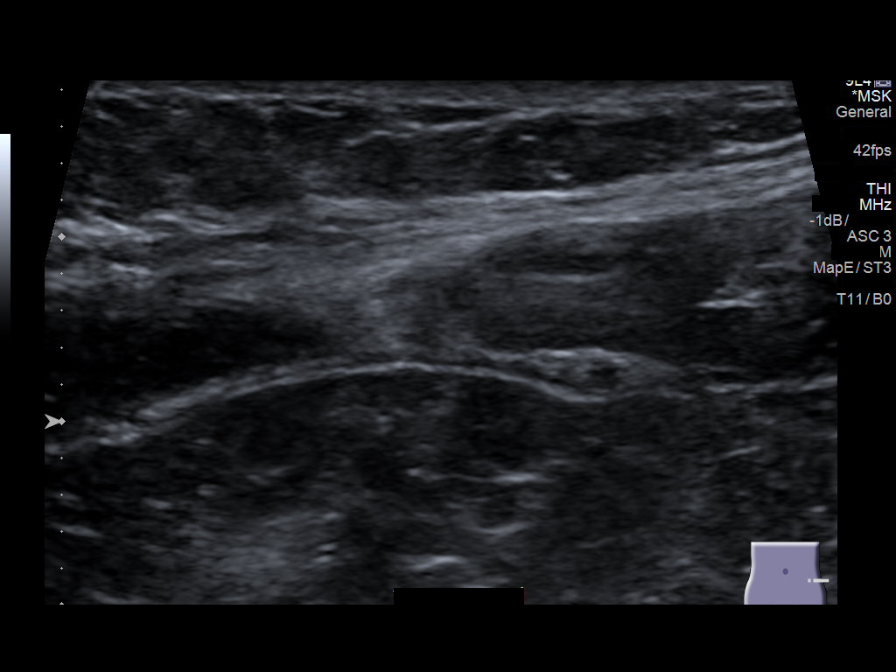
[im 7/9]
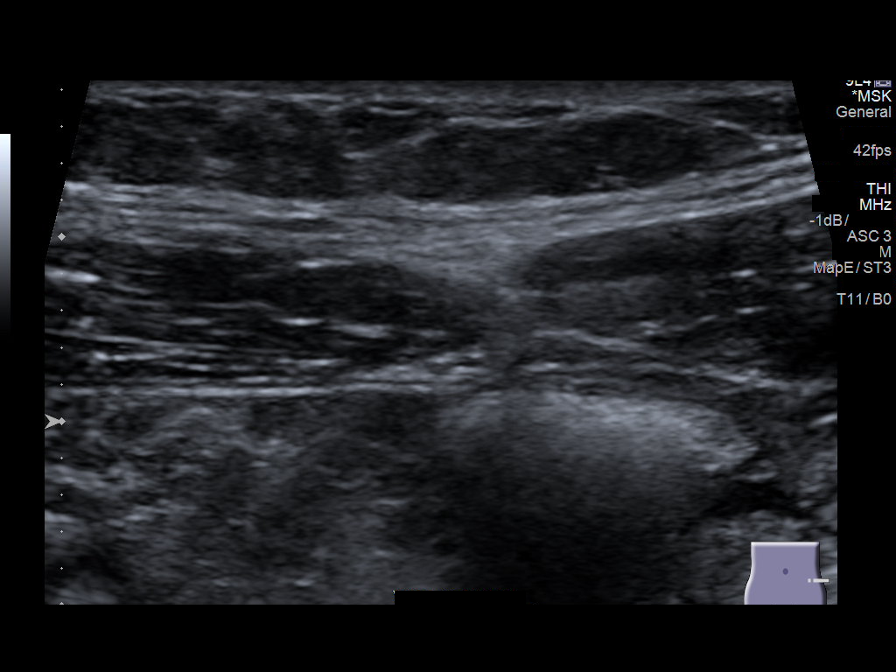
[im 8/9]
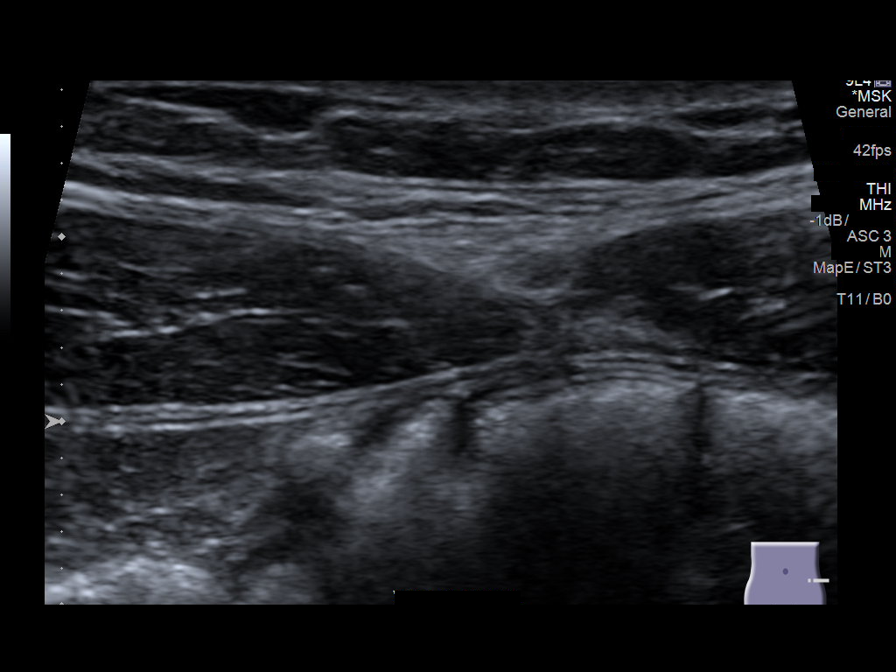
[im 9/9]
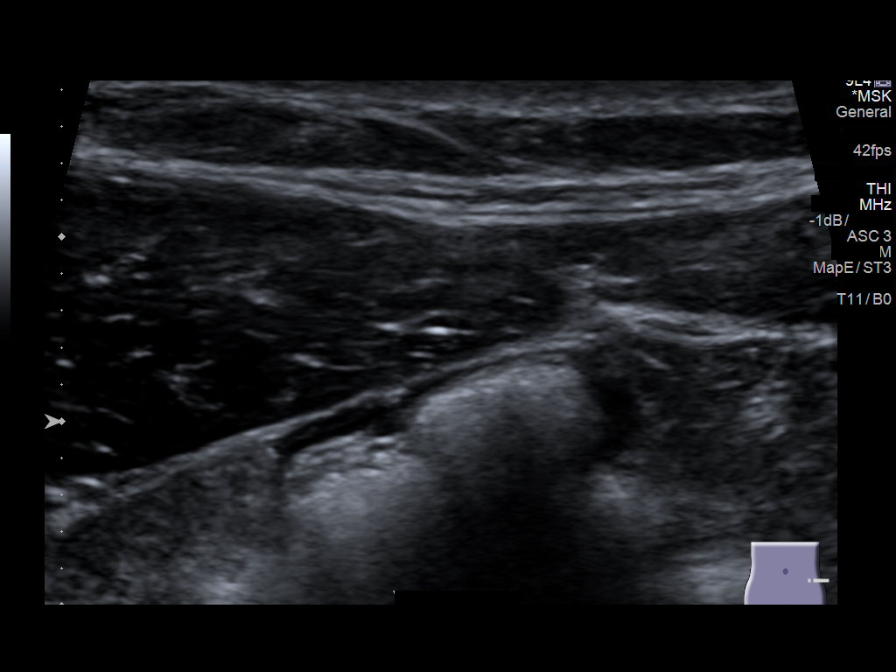

[9 of 9 positions shown; findings below may reference images not displayed]

FINDINGS: Targeted evaluation of the left lower quadrant of the abdomen was
performed with attention for evaluating for possibility of hernia.

No bowel containing hernia or subcutaneous soft tissue mass
identified.
IMPRESSION: No bowel containing hernia or subcutaneous soft tissue mass
identified within the left lower quadrant anterior abdominal wall
where patient describes pain over the past 2-3 weeks.

## 2019-07-19 ENCOUNTER — Other Ambulatory Visit: Payer: Self-pay

## 2019-07-19 DIAGNOSIS — Z20822 Contact with and (suspected) exposure to covid-19: Secondary | ICD-10-CM

## 2019-07-20 LAB — NOVEL CORONAVIRUS, NAA: SARS-CoV-2, NAA: NOT DETECTED

## 2019-08-26 ENCOUNTER — Ambulatory Visit: Payer: BLUE CROSS/BLUE SHIELD | Attending: Internal Medicine

## 2019-08-26 ENCOUNTER — Other Ambulatory Visit: Payer: Self-pay

## 2019-08-26 DIAGNOSIS — Z20822 Contact with and (suspected) exposure to covid-19: Secondary | ICD-10-CM

## 2019-08-28 LAB — NOVEL CORONAVIRUS, NAA: SARS-CoV-2, NAA: NOT DETECTED

## 2019-08-30 ENCOUNTER — Other Ambulatory Visit: Payer: BLUE CROSS/BLUE SHIELD

## 2019-09-08 ENCOUNTER — Ambulatory Visit: Payer: BLUE CROSS/BLUE SHIELD | Attending: Internal Medicine

## 2019-09-08 DIAGNOSIS — Z20822 Contact with and (suspected) exposure to covid-19: Secondary | ICD-10-CM

## 2019-09-10 LAB — NOVEL CORONAVIRUS, NAA: SARS-CoV-2, NAA: NOT DETECTED

## 2019-09-12 ENCOUNTER — Ambulatory Visit: Payer: BLUE CROSS/BLUE SHIELD | Attending: Internal Medicine

## 2019-09-12 DIAGNOSIS — Z20822 Contact with and (suspected) exposure to covid-19: Secondary | ICD-10-CM

## 2019-09-13 ENCOUNTER — Ambulatory Visit (INDEPENDENT_AMBULATORY_CARE_PROVIDER_SITE_OTHER): Payer: 59 | Admitting: Cardiology

## 2019-09-13 ENCOUNTER — Other Ambulatory Visit: Payer: Self-pay

## 2019-09-13 ENCOUNTER — Ambulatory Visit: Payer: BLUE CROSS/BLUE SHIELD | Admitting: Cardiology

## 2019-09-13 ENCOUNTER — Encounter: Payer: Self-pay | Admitting: Cardiology

## 2019-09-13 VITALS — BP 120/72 | HR 77 | Ht 68.0 in | Wt 189.0 lb

## 2019-09-13 DIAGNOSIS — Z01812 Encounter for preprocedural laboratory examination: Secondary | ICD-10-CM | POA: Diagnosis not present

## 2019-09-13 DIAGNOSIS — I10 Essential (primary) hypertension: Secondary | ICD-10-CM

## 2019-09-13 DIAGNOSIS — R079 Chest pain, unspecified: Secondary | ICD-10-CM | POA: Diagnosis not present

## 2019-09-13 DIAGNOSIS — R0602 Shortness of breath: Secondary | ICD-10-CM

## 2019-09-13 DIAGNOSIS — R5383 Other fatigue: Secondary | ICD-10-CM

## 2019-09-13 LAB — NOVEL CORONAVIRUS, NAA: SARS-CoV-2, NAA: NOT DETECTED

## 2019-09-13 MED ORDER — NITROGLYCERIN 0.4 MG SL SUBL: 0.4000 mg | SUBLINGUAL_TABLET | SUBLINGUAL | 3 refills | Status: AC | PRN

## 2019-09-13 MED ORDER — METOPROLOL TARTRATE 50 MG PO TABS: ORAL_TABLET | ORAL | 0 refills | Status: AC

## 2019-09-13 NOTE — Patient Instructions (Addendum)
Medication Instructions:  Your physician has recommended you make the following change in your medication:   Nitroglycerin 0.4 mg sublingual (under your tongue) as needed for chest pain. If experiencing chest pain, stop what you are doing and sit down. Take 1 nitroglycerin and wait 5 minutes. If chest pain continues, take another nitroglycerin and wait 5 minutes. If chest pain does not subside, take 1 more nitroglycerin and dial 911. You make take a total of 3 nitroglycerin in a 15 minute time frame.    *If you need a refill on your cardiac medications before your next appointment, please call your pharmacy*  Lab Work: Your physician recommends that you return for lab work in:   3-7 days prior to CT:BMP  If you have labs (blood work) drawn today and your tests are completely normal, you will receive your results only by: Marland Kitchen MyChart Message (if you have MyChart) OR . A paper copy in the mail If you have any lab test that is abnormal or we need to change your treatment, we will call you to review the results.  Testing/Procedures: Your physician has requested that you have an echocardiogram. Echocardiography is a painless test that uses sound waves to create images of your heart. It provides your doctor with information about the size and shape of your heart and how well your heart's chambers and valves are working. This procedure takes approximately one hour. There are no restrictions for this procedure.  Your physician has requested that you have cardiac CT. Cardiac computed tomography (CT) is a painless test that uses an x-ray machine to take clear, detailed pictures of your heart. For further information please visit [email or web address] Please follow instruction sheet as given.   Your cardiac CT will be scheduled at one of the below locations:   [facility] [address] [place], [place] [address] [phone]  If scheduled at [facility], please arrive at the  [facility] main entrance of [facility] 30-45 minutes prior to test start time. Proceed to the [facility] Radiology Department (first floor) to check-in and test prep.  Please follow these instructions carefully (unless otherwise directed):  Hold all erectile dysfunction medications at least 3 days (72 hrs) prior to test.  On the Night Before the Test: . Be sure to Drink plenty of water. . Do not consume any caffeinated/decaffeinated beverages or chocolate 12 hours prior to your test. . Do not take any antihistamines 12 hours prior to your test.  On the Day of the Test: . Drink plenty of water. Do not drink any water within one hour of the test. . Do not eat any food 4 hours prior to the test. . You may take your regular medications prior to the test.  . Take metoprolol (Lopressor) two hours prior to test.                   -If HR is less than 55 BPM- No Beta Blocker(metoprolol)                -IF HR is greater than 55 BPM and patient is less than or equal to 32 yrs old Lopressor(metoprolol) 100mg  x1. .    After the Test: . Drink plenty of water. . After receiving IV contrast, you may experience a mild flushed feeling. This is normal. . On occasion, you may experience a mild rash up to 24 hours after the test. This is not dangerous. If this occurs, you can  take Benadryl 25 mg and increase your fluid intake. . If you experience trouble breathing, this can be serious. If it is severe call 911 IMMEDIATELY. If it is mild, please call our office.    Once we have confirmed authorization from your insurance company, we will call you to set up a date and time for your test.   For non-scheduling related questions, please contact the cardiac imaging nurse navigator should you have any questions/concerns: Marchia Bond, RN Navigator Cardiac Imaging [facility] [phone] Office    Follow-Up: At [facility], you and your health needs are our  priority.  As part of our continuing mission to provide you with exceptional heart care, we have created designated Provider Care Teams.  These Care Teams include your primary Cardiologist (physician) and Advanced Practice Providers (APPs -  Physician Assistants and Nurse Practitioners) who all work together to provide you with the care you need, when you need it.  Your next appointment:   3 month(s)  The format for your next appointment:   In Person  Provider:   Berniece Salines, DO  Other Instructions  Echocardiogram An echocardiogram is a procedure that uses painless sound waves (ultrasound) to produce an image of the heart. Images from an echocardiogram can provide important information about:  Signs of coronary artery disease (CAD).  Aneurysm detection. An aneurysm is a weak or damaged part of an artery wall that bulges out from the normal force of blood pumping through the body.  Heart size and shape. Changes in the size or shape of the heart can be associated with certain conditions, including heart failure, aneurysm, and CAD.  Heart muscle function.  Heart valve function.  Signs of a past heart attack.  Fluid buildup around the heart.  Thickening of the heart muscle.  A tumor or infectious growth around the heart valves. Tell a health care provider about:  Any allergies you have.  All medicines you are taking, including vitamins, herbs, eye drops, creams, and over-the-counter medicines.  Any blood disorders you have.  Any surgeries you have had.  Any medical conditions you have.  Whether you are pregnant or may be pregnant. What are the risks? Generally, this is a safe procedure. However, problems may occur, including:  Allergic reaction to dye (contrast) that may be used during the procedure. What happens before the procedure? No specific preparation is needed. You may eat and drink normally. What happens during the procedure?   An IV tube may be inserted  into one of your veins.  You may receive contrast through this tube. A contrast is an injection that improves the quality of the pictures from your heart.  A gel will be applied to your chest.  A wand-like tool (transducer) will be moved over your chest. The gel will help to transmit the sound waves from the transducer.  The sound waves will harmlessly bounce off of your heart to allow the heart images to be captured in real-time motion. The images will be recorded on a computer. The procedure may vary among health care providers and hospitals. What happens after the procedure?  You may return to your normal, everyday life, including diet, activities, and medicines, unless your health care provider tells you not to do that. Summary  An echocardiogram is a procedure that uses painless sound waves (ultrasound) to produce an image of the heart.  Images from an echocardiogram can provide important information about the size and shape of your heart,  heart muscle function, heart valve function, and fluid buildup around your heart.  You do not need to do anything to prepare before this procedure. You may eat and drink normally.  After the echocardiogram is completed, you may return to your normal, everyday life, unless your health care provider tells you not to do that. This information is not intended to replace advice given to you by your health care provider. Make sure you discuss any questions you have with your health care provider. Document Revised: 12/16/2018 Document Reviewed: 09/27/2016 Elsevier Patient Education  2020 Elsevier Inc.  Cardiac CT Angiogram A cardiac CT angiogram is a procedure to look at the heart and the area around the heart. It may be done to help find the cause of chest pains or other symptoms of heart disease. During this procedure, a substance called contrast dye is injected into the blood vessels in the area to be checked. A large X-ray machine, called a CT  scanner, then takes detailed pictures of the heart and the surrounding area. The procedure is also sometimes called a coronary CT angiogram, coronary artery scanning, or CTA. A cardiac CT angiogram allows the health care provider to see how well blood is flowing to and from the heart. The health care provider will be able to see if there are any problems, such as:  Blockage or narrowing of the coronary arteries in the heart.  Fluid around the heart.  Signs of weakness or disease in the muscles, valves, and tissues of the heart. Tell a health care provider about:  Any allergies you have. This is especially important if you have had a previous allergic reaction to contrast dye.  All medicines you are taking, including vitamins, herbs, eye drops, creams, and over-the-counter medicines.  Any blood disorders you have.  Any surgeries you have had.  Any medical conditions you have.  Whether you are pregnant or may be pregnant.  Any anxiety disorders, chronic pain, or other conditions you have that may increase your stress or prevent you from lying still. What are the risks? Generally, this is a safe procedure. However, problems may occur, including:  Bleeding.  Infection.  Allergic reactions to medicines or dyes.  Damage to other structures or organs.  Kidney damage from the contrast dye that is used.  Increased risk of cancer from radiation exposure. This risk is low. Talk with your health care provider about: ? The risks and benefits of testing. ? How you can receive the lowest dose of radiation. What happens before the procedure?  Wear comfortable clothing and remove any jewelry, glasses, dentures, and hearing aids.  Follow instructions from your health care provider about eating and drinking. This may include: ? For 12 hours before the procedure -- avoid caffeine. This includes tea, coffee, soda, energy drinks, and diet pills. Drink plenty of water or other fluids that do not  have caffeine in them. Being well hydrated can prevent complications. ? For 4-6 hours before the procedure -- stop eating and drinking. The contrast dye can cause nausea, but this is less likely if your stomach is empty.  Ask your health care provider about changing or stopping your regular medicines. This is especially important if you are taking diabetes medicines, blood thinners, or medicines to treat problems with erections (erectile dysfunction). What happens during the procedure?   Hair on your chest may need to be removed so that small sticky patches called electrodes can be placed on your chest. These will transmit information that  helps to monitor your heart during the procedure.  An IV will be inserted into one of your veins.  You might be given a medicine to control your heart rate during the procedure. This will help to ensure that good images are obtained.  You will be asked to lie on an exam table. This table will slide in and out of the CT machine during the procedure.  Contrast dye will be injected into the IV. You might feel warm, or you may get a metallic taste in your mouth.  You will be given a medicine called nitroglycerin. This will relax or dilate the arteries in your heart.  The table that you are lying on will move into the CT machine tunnel for the scan.  The person running the machine will give you instructions while the scans are being done. You may be asked to: ? Keep your arms above your head. ? Hold your breath. ? Stay very still, even if the table is moving.  When the scanning is complete, you will be moved out of the machine.  The IV will be removed. The procedure may vary among health care providers and hospitals. What can I expect after the procedure? After your procedure, it is common to have:  A metallic taste in your mouth from the contrast dye.  A feeling of warmth.  A headache from the nitroglycerin. Follow these instructions at  home:  Take over-the-counter and prescription medicines only as told by your health care provider.  If you are told, drink enough fluid to keep your urine pale yellow. This will help to flush the contrast dye out of your body.  Most people can return to their normal activities right after the procedure. Ask your health care provider what activities are safe for you.  It is up to you to get the results of your procedure. Ask your health care provider, or the department that is doing the procedure, when your results will be ready.  Keep all follow-up visits as told by your health care provider. This is important. Contact a health care provider if:  You have any symptoms of allergy to the contrast dye. These include: ? Shortness of breath. ? Rash or hives. ? A racing heartbeat. Summary  A cardiac CT angiogram is a procedure to look at the heart and the area around the heart. It may be done to help find the cause of chest pains or other symptoms of heart disease.  During this procedure, a large X-ray machine, called a CT scanner, takes detailed pictures of the heart and the surrounding area after a contrast dye has been injected into blood vessels in the area.  Ask your health care provider about changing or stopping your regular medicines before the procedure. This is especially important if you are taking diabetes medicines, blood thinners, or medicines to treat erectile dysfunction.  If you are told, drink enough fluid to keep your urine pale yellow. This will help to flush the contrast dye out of your body. This information is not intended to replace advice given to you by your health care provider. Make sure you discuss any questions you have with your health care provider. Document Revised: 04/20/2019 Document Reviewed: 04/20/2019 Elsevier Patient Education  2020 [employer].

## 2019-09-13 NOTE — Progress Notes (Signed)
Cardiology Office Note:    Date:  09/13/2019   ID:  Ronnie Wood, DOB 07/21/1966, MRN [id number]  PCP:  Ronnie James, MD  Cardiologist:  Thomasene Ripple, DO  Electrophysiologist:  None   Referring MD: Ronnie Montana, MD   The patient presented with chest pain, shortness of breath and fatigue  History of Present Illness:    Ronnie Wood is a 54 y.o. male with a hx of hypertension, hyperlipidemia and family history of significant premature heart disease in his older brother who presents today to be evaluated for chest pain.  Patient tells me that over the last several months he has been experiencing left-sided chest tightness with no radiation.  He notes that this is intermittent and lasts a few seconds at the time he states that this is all new and has not happened to him in the past.  He admits to generalized fatigue as well as shortness of breath that is associated with this.  Denies any lightheadedness, dizziness and nausea or vomiting.  Past Medical History:  Diagnosis Date  . Allergy   . Depression   . Diverticulosis   . Eczema   . Erectile dysfunction   . GERD (gastroesophageal reflux disease)   . Hypersomnolence   . Hypertension   . Prostatitis, chronic   . Sleep apnea     Past Surgical History:  Procedure Laterality Date  . CERVICAL FUSION  09/2009   C3-4, C5-6; Dr. Marikay Alar  . CIRCUMCISION  2004   resulted in hypopigmentation of the glans  . VASECTOMY      Current Medications: Current Meds  Medication Sig  . allopurinol (ZYLOPRIM) 300 MG tablet Take 300 mg by mouth daily.  Marland Kitchen amLODipine (NORVASC) 10 MG tablet Take 10 mg by mouth daily.   Marland Kitchen amoxicillin (AMOXIL) 500 MG tablet Take 500 mg by mouth 3 (three) times daily.  Marland Kitchen atorvastatin (LIPITOR) 10 MG tablet Take 10 mg by mouth daily.  Marland Kitchen losartan (COZAAR) 100 MG tablet Take 100 mg by mouth daily.     Allergies:   Sulfa antibiotics   Social History   Socioeconomic History  . Marital status: Married    Spouse  name: n/a  . Number of children: 2  . Years of education: college  . Highest education level: Not on file  Occupational History  . 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 Occupation: basketball official  Tobacco Use  . Smoking status: Never Smoker  . Smokeless tobacco: Never Used  Substance and Sexual Activity  . Alcohol use: Yes    Alcohol/week: 5.0 standard drinks    Types: 5 drink(s) per week  . Drug use: No  . Sexual activity: Yes    Partners: Female    Birth control/protection: Condom  Other Topics Concern  . Not on file  Social History Narrative   Lives alone.  Divorced.  His older daughter graduated from [employer] of Therapist, sports and is currently Futures trader at [employer]. His younger daughter is a Biochemist, clinical and track star (200 m, 400 m) at [employer]. The patient runs about 30 minutes 4 days/week.   Social Determinants of Health   Financial Resource Strain:   . Difficulty of Paying Living Expenses: Not on file  Food Insecurity:   . Worried About Programme researcher, broadcasting/film/video in the Last Year: Not on file  . Ran Out of Food in the  Last Year: Not on file  Transportation Needs:   . Lack of Transportation (Medical): Not on file  . Lack of Transportation (Non-Medical): Not on file  Physical Activity:   . Days of Exercise per Week: Not on file  . Minutes of Exercise per Session: Not on file  Stress:   . Feeling of Stress : Not on file  Social Connections:   . Frequency of Communication with Friends and Family: Not on file  . Frequency of Social Gatherings with Friends and Family: Not on file  . Attends Religious Services: Not on file  . Active Member of Clubs or Organizations: Not on file  . Attends Banker Meetings: Not on file  . Marital Status: Not on file     Family History: The patient's family history includes Cancer - Colon in his mother; Diabetes in his  sister; Heart disease in his brother; Hypertension in his brother and mother.  ROS:   Review of Systems  Constitution: Negative for decreased appetite, fever and weight gain.  HENT: Negative for congestion, ear discharge, hoarse voice and sore throat.   Eyes: Negative for discharge, redness, vision loss in right eye and visual halos.  Cardiovascular: Reports chest pain, dyspnea on exertion.  Negative for leg swelling, orthopnea and palpitations.  Respiratory: Negative for cough, hemoptysis, shortness of breath and snoring.   Endocrine: Negative for heat intolerance and polyphagia.  Hematologic/Lymphatic: Negative for bleeding problem. Does not bruise/bleed easily.  Skin: Negative for flushing, nail changes, rash and suspicious lesions.  Musculoskeletal: Negative for arthritis, joint pain, muscle cramps, myalgias, neck pain and stiffness.  Gastrointestinal: Negative for abdominal pain, bowel incontinence, diarrhea and excessive appetite.  Genitourinary: Negative for decreased libido, genital sores and incomplete emptying.  Neurological: Negative for brief paralysis, focal weakness, headaches and loss of balance.  Psychiatric/Behavioral: Negative for altered mental status, depression and suicidal ideas.  Allergic/Immunologic: Negative for HIV exposure and persistent infections.    EKGs/Labs/Other Studies Reviewed:    The following studies were reviewed today:   EKG:  The ekg ordered today demonstrates   Recent Labs: No results found for requested labs within last 8760 hours.  Recent Lipid Panel    Component Value Date/Time   CHOL 195 05/13/2013 1011   TRIG 205 (H) 05/13/2013 1011   HDL 34 (L) 05/13/2013 1011   CHOLHDL 5.7 05/13/2013 1011   VLDL 41 (H) 05/13/2013 1011   LDLCALC 120 (H) 05/13/2013 1011    Physical Exam:    VS:  BP 120/72 (BP Location: Right Arm, Patient Position: Sitting, Cuff Size: Normal)   Pulse 77   Ht 5\' 8"  (1.727 m)   Wt 189 lb (85.7 kg)   SpO2 98%    BMI 28.74 kg/m     Wt Readings from Last 3 Encounters:  09/13/19 189 lb (85.7 kg)  10/10/13 172 lb (78 kg)  05/13/13 173 lb (78.5 kg)     GEN: Well nourished, well developed in no acute distress HEENT: Normal NECK: No JVD; No carotid bruits LYMPHATICS: No lymphadenopathy CARDIAC: S1S2 noted,RRR, no murmurs, rubs, gallops RESPIRATORY:  Clear to auscultation without rales, wheezing or rhonchi  ABDOMEN: Soft, non-tender, non-distended, +bowel sounds, no guarding. EXTREMITIES: No edema, No cyanosis, no clubbing MUSCULOSKELETAL:  No edema; No deformity  SKIN: Warm and dry NEUROLOGIC:  Alert and oriented x 3, non-focal PSYCHIATRIC:  Normal affect, good insight  ASSESSMENT:    1. Chest pain, unspecified type   2. Shortness of breath   3. Pre-procedure  lab exam   4. Essential hypertension   5. Fatigue, unspecified type    PLAN:    1.  His chest pain is concerning given his risk factors.  Therefore at this time I am going to pursue an ischemic evaluation in this patient.  He denies any IV contrast allergy.  Therefore CTA coronaries would be appropriate at this time.  Sublingual nitroglycerin prescription was sent, its protocol and 911 protocol explained and the patient vocalized understanding questions were answered to the patient's satisfaction  He is also short of breath and fatigue therefore I will order a transthoracic echocardiogram to assess RV/RV function and any structural abnormalities.  2.  Hypertension-his blood pressure is is appropriate we will continue patient on his current regimen with losartan 100 mg daily which was just recently uptitrated and amlodipine 10 mg daily per  3.  Hyperlipidemia continue patient on his statin regimen.  The patient is in agreement with the above plan. The patient left the office in stable condition.  The patient will follow up in 3 months or sooner if needed.  Medication Adjustments/Labs and Tests Ordered: Current medicines are reviewed  at length with the patient today.  Concerns regarding medicines are outlined above.  Orders Placed This Encounter  Procedures  . CT CORONARY FRACTIONAL FLOW RESERVE DATA PREP  . CT CORONARY FRACTIONAL FLOW RESERVE FLUID ANALYSIS  . CT CORONARY MORPH W/CTA COR W/SCORE W/CA W/CM &/OR WO/CM  . Basic Metabolic Panel (BMET)  . EKG 12-Lead  . ECHOCARDIOGRAM COMPLETE   Meds ordered this encounter  Medications  . metoprolol tartrate (LOPRESSOR) 50 MG tablet    Sig: Take 2 tabs(100 mg ) once 2 hours prior to CT if heart rate greater than 55    Dispense:  2 tablet    Refill:  0    There are no Patient Instructions on file for this visit.   Adopting a Healthy Lifestyle.  Know what a healthy weight is for you (roughly BMI <25) and aim to maintain this   Aim for 7+ servings of fruits and vegetables daily   65-80+ fluid ounces of water or unsweet tea for healthy kidneys   Limit to max 1 drink of alcohol per day; avoid smoking/tobacco   Limit animal fats in diet for cholesterol and heart health - choose grass fed whenever available   Avoid highly processed foods, and foods high in saturated/trans fats   Aim for low stress - take time to unwind and care for your mental health   Aim for 150 min of moderate intensity exercise weekly for heart health, and weights twice weekly for bone health   Aim for 7-9 hours of sleep daily   When it comes to diets, agreement about the perfect plan isnt easy to find, even among the experts. Experts at the [facility] [employer] developed an idea known as the Healthy Eating Plate. Just imagine a plate divided into logical, healthy portions.   The emphasis is on diet quality:   Load up on vegetables and fruits - one-half of your plate: Aim for color and variety, and remember that potatoes dont count.   Go for whole grains - one-quarter of your plate: Whole wheat, barley, wheat berries, quinoa, oats, brown rice, and foods made with them. If you  want pasta, go with whole wheat pasta.   Protein power - one-quarter of your plate: Fish, chicken, beans, and nuts are all healthy, versatile protein sources. Limit red meat.   The  diet, however, does go beyond the plate, offering a few other suggestions.   Use healthy plant oils, such as olive, canola, soy, corn, sunflower and peanut. Check the labels, and avoid partially hydrogenated oil, which have unhealthy trans fats.   If youre thirsty, drink water. Coffee and tea are good in moderation, but skip sugary drinks and limit milk and dairy products to one or two daily servings.   The type of carbohydrate in the diet is more important than the amount. Some sources of carbohydrates, such as vegetables, fruits, whole grains, and beans-are healthier than others.   Finally, stay active  Signed, Berniece Salines, DO  09/13/2019 2:12 PM    [facility]

## 2019-09-14 ENCOUNTER — Ambulatory Visit (HOSPITAL_BASED_OUTPATIENT_CLINIC_OR_DEPARTMENT_OTHER): Admission: RE | Admit: 2019-09-14 | Payer: 59 | Source: Ambulatory Visit

## 2019-09-15 ENCOUNTER — Ambulatory Visit: Payer: 59 | Attending: Internal Medicine

## 2019-09-15 DIAGNOSIS — Z20822 Contact with and (suspected) exposure to covid-19: Secondary | ICD-10-CM

## 2019-09-17 LAB — NOVEL CORONAVIRUS, NAA: SARS-CoV-2, NAA: NOT DETECTED

## 2019-09-19 ENCOUNTER — Ambulatory Visit: Payer: 59 | Attending: Internal Medicine

## 2019-09-19 DIAGNOSIS — Z20822 Contact with and (suspected) exposure to covid-19: Secondary | ICD-10-CM

## 2019-09-20 LAB — NOVEL CORONAVIRUS, NAA: SARS-CoV-2, NAA: NOT DETECTED

## 2019-09-21 ENCOUNTER — Other Ambulatory Visit: Payer: Self-pay

## 2019-09-21 ENCOUNTER — Ambulatory Visit (HOSPITAL_BASED_OUTPATIENT_CLINIC_OR_DEPARTMENT_OTHER)
Admission: RE | Admit: 2019-09-21 | Discharge: 2019-09-21 | Disposition: A | Discharge status: Home / Self Care | Payer: 59 | Source: Ambulatory Visit | Attending: Cardiology | Admitting: Cardiology

## 2019-09-21 DIAGNOSIS — R079 Chest pain, unspecified: Secondary | ICD-10-CM | POA: Diagnosis present

## 2019-09-21 DIAGNOSIS — I10 Essential (primary) hypertension: Secondary | ICD-10-CM | POA: Insufficient documentation

## 2019-09-21 NOTE — Progress Notes (Signed)
  Echocardiogram 2D Echocardiogram has been performed.  Ronnie Wood 09/21/2019, 9:40 AM

## 2019-09-22 ENCOUNTER — Ambulatory Visit: Payer: 59 | Attending: Internal Medicine

## 2019-09-22 DIAGNOSIS — Z20822 Contact with and (suspected) exposure to covid-19: Secondary | ICD-10-CM

## 2019-09-23 LAB — NOVEL CORONAVIRUS, NAA: SARS-CoV-2, NAA: DETECTED — AB

## 2019-09-26 ENCOUNTER — Ambulatory Visit: Payer: 59 | Attending: Internal Medicine

## 2019-09-26 DIAGNOSIS — Z20822 Contact with and (suspected) exposure to covid-19: Secondary | ICD-10-CM

## 2019-09-27 LAB — NOVEL CORONAVIRUS, NAA: SARS-CoV-2, NAA: DETECTED — AB

## 2019-09-28 NOTE — Progress Notes (Signed)
Reviewed 2nd positive Covid 19 results with patient. 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 Reminded patient he should continue to isolate for the full 10 days from 1st positive as he reported no symptoms except nasal congestion. Continue to monitor breathing, any concerns call your doctor or seek treatment immediately at the ED with a mask on. Discussed not retesting until the end of 14 days after the date of the 1st positive would be his best chance of a negative test results. Stated he understood.

## 2019-09-29 ENCOUNTER — Other Ambulatory Visit: Payer: 59

## 2019-10-03 ENCOUNTER — Other Ambulatory Visit: Payer: 59

## 2019-10-05 ENCOUNTER — Ambulatory Visit: Payer: Self-pay | Admitting: *Deleted

## 2019-10-05 ENCOUNTER — Telehealth: Payer: Self-pay | Admitting: *Deleted

## 2019-10-05 NOTE — Telephone Encounter (Addendum)
Pt states that he tested positive for COVID on 09/22/2019 and wants to know how long he needs to wait to get his COVID vaccine.  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          Pt stated that he has been notified.

## 2019-10-05 NOTE — Telephone Encounter (Signed)
Pt called to inquire how long after having COVID before he could get his first vaccine. Instructed that the CDC recommends 30 days after isolation complete and completely recovered or 45 days after + test if no symptoms. Pt verbalized understanding.

## 2019-12-09 ENCOUNTER — Ambulatory Visit: Payer: BLUE CROSS/BLUE SHIELD | Admitting: Cardiology

## 2019-12-29 ENCOUNTER — Other Ambulatory Visit: Payer: Self-pay

## 2019-12-29 ENCOUNTER — Ambulatory Visit: Payer: 59 | Admitting: Cardiology

## 2021-10-21 DIAGNOSIS — R972 Elevated prostate specific antigen [PSA]: Secondary | ICD-10-CM | POA: Diagnosis not present

## 2021-10-21 DIAGNOSIS — R300 Dysuria: Secondary | ICD-10-CM | POA: Diagnosis not present

## 2021-10-21 DIAGNOSIS — N401 Enlarged prostate with lower urinary tract symptoms: Secondary | ICD-10-CM | POA: Diagnosis not present

## 2021-11-25 DIAGNOSIS — Z125 Encounter for screening for malignant neoplasm of prostate: Secondary | ICD-10-CM | POA: Diagnosis not present

## 2021-11-25 DIAGNOSIS — Z0000 Encounter for general adult medical examination without abnormal findings: Secondary | ICD-10-CM | POA: Diagnosis not present

## 2021-11-25 DIAGNOSIS — E785 Hyperlipidemia, unspecified: Secondary | ICD-10-CM | POA: Diagnosis not present

## 2021-11-29 DIAGNOSIS — R350 Frequency of micturition: Secondary | ICD-10-CM | POA: Diagnosis not present

## 2021-11-29 DIAGNOSIS — R972 Elevated prostate specific antigen [PSA]: Secondary | ICD-10-CM | POA: Diagnosis not present

## 2021-11-29 DIAGNOSIS — N401 Enlarged prostate with lower urinary tract symptoms: Secondary | ICD-10-CM | POA: Diagnosis not present

## 2022-02-20 DIAGNOSIS — T300 Burn of unspecified body region, unspecified degree: Secondary | ICD-10-CM | POA: Diagnosis not present

## 2022-02-20 DIAGNOSIS — M109 Gout, unspecified: Secondary | ICD-10-CM | POA: Diagnosis not present

## 2022-02-26 DIAGNOSIS — L089 Local infection of the skin and subcutaneous tissue, unspecified: Secondary | ICD-10-CM | POA: Diagnosis not present

## 2022-02-26 DIAGNOSIS — T24202A Burn of second degree of unspecified site of left lower limb, except ankle and foot, initial encounter: Secondary | ICD-10-CM | POA: Diagnosis not present

## 2022-03-05 DIAGNOSIS — T24202D Burn of second degree of unspecified site of left lower limb, except ankle and foot, subsequent encounter: Secondary | ICD-10-CM | POA: Diagnosis not present

## 2022-03-07 ENCOUNTER — Ambulatory Visit: Payer: Self-pay | Admitting: Physician Assistant

## 2022-05-30 DIAGNOSIS — I10 Essential (primary) hypertension: Secondary | ICD-10-CM | POA: Diagnosis not present

## 2022-05-30 DIAGNOSIS — E785 Hyperlipidemia, unspecified: Secondary | ICD-10-CM | POA: Diagnosis not present

## 2022-05-30 DIAGNOSIS — Z23 Encounter for immunization: Secondary | ICD-10-CM | POA: Diagnosis not present

## 2022-05-30 DIAGNOSIS — M109 Gout, unspecified: Secondary | ICD-10-CM | POA: Diagnosis not present

## 2022-05-30 DIAGNOSIS — E663 Overweight: Secondary | ICD-10-CM | POA: Diagnosis not present

## 2022-06-05 DIAGNOSIS — R972 Elevated prostate specific antigen [PSA]: Secondary | ICD-10-CM | POA: Diagnosis not present

## 2022-06-05 DIAGNOSIS — N401 Enlarged prostate with lower urinary tract symptoms: Secondary | ICD-10-CM | POA: Diagnosis not present

## 2022-06-05 DIAGNOSIS — R351 Nocturia: Secondary | ICD-10-CM | POA: Diagnosis not present

## 2022-11-14 DIAGNOSIS — N401 Enlarged prostate with lower urinary tract symptoms: Secondary | ICD-10-CM | POA: Diagnosis not present

## 2022-11-14 DIAGNOSIS — R351 Nocturia: Secondary | ICD-10-CM | POA: Diagnosis not present

## 2022-11-14 DIAGNOSIS — N5201 Erectile dysfunction due to arterial insufficiency: Secondary | ICD-10-CM | POA: Diagnosis not present

## 2022-11-14 DIAGNOSIS — R972 Elevated prostate specific antigen [PSA]: Secondary | ICD-10-CM | POA: Diagnosis not present

## 2022-12-31 DIAGNOSIS — Z0000 Encounter for general adult medical examination without abnormal findings: Secondary | ICD-10-CM | POA: Diagnosis not present

## 2022-12-31 DIAGNOSIS — E785 Hyperlipidemia, unspecified: Secondary | ICD-10-CM | POA: Diagnosis not present

## 2023-05-06 DIAGNOSIS — R7301 Impaired fasting glucose: Secondary | ICD-10-CM | POA: Diagnosis not present

## 2023-05-06 DIAGNOSIS — I10 Essential (primary) hypertension: Secondary | ICD-10-CM | POA: Diagnosis not present

## 2023-05-06 DIAGNOSIS — R350 Frequency of micturition: Secondary | ICD-10-CM | POA: Diagnosis not present

## 2023-05-06 DIAGNOSIS — N400 Enlarged prostate without lower urinary tract symptoms: Secondary | ICD-10-CM | POA: Diagnosis not present

## 2023-05-13 DIAGNOSIS — N401 Enlarged prostate with lower urinary tract symptoms: Secondary | ICD-10-CM | POA: Diagnosis not present

## 2023-05-13 DIAGNOSIS — R351 Nocturia: Secondary | ICD-10-CM | POA: Diagnosis not present

## 2023-06-04 DIAGNOSIS — R351 Nocturia: Secondary | ICD-10-CM | POA: Diagnosis not present

## 2023-06-04 DIAGNOSIS — R3915 Urgency of urination: Secondary | ICD-10-CM | POA: Diagnosis not present

## 2023-06-04 DIAGNOSIS — N401 Enlarged prostate with lower urinary tract symptoms: Secondary | ICD-10-CM | POA: Diagnosis not present

## 2023-06-04 DIAGNOSIS — R350 Frequency of micturition: Secondary | ICD-10-CM | POA: Diagnosis not present

## 2023-06-09 DIAGNOSIS — Z1331 Encounter for screening for depression: Secondary | ICD-10-CM | POA: Diagnosis not present

## 2023-06-09 DIAGNOSIS — Z23 Encounter for immunization: Secondary | ICD-10-CM | POA: Diagnosis not present

## 2023-08-11 DIAGNOSIS — R399 Unspecified symptoms and signs involving the genitourinary system: Secondary | ICD-10-CM | POA: Diagnosis not present

## 2023-08-11 DIAGNOSIS — M25552 Pain in left hip: Secondary | ICD-10-CM | POA: Diagnosis not present

## 2023-12-16 DIAGNOSIS — E785 Hyperlipidemia, unspecified: Secondary | ICD-10-CM | POA: Diagnosis not present

## 2023-12-16 DIAGNOSIS — I10 Essential (primary) hypertension: Secondary | ICD-10-CM | POA: Diagnosis not present

## 2024-04-06 DIAGNOSIS — R310 Gross hematuria: Secondary | ICD-10-CM | POA: Diagnosis not present

## 2024-04-06 DIAGNOSIS — R972 Elevated prostate specific antigen [PSA]: Secondary | ICD-10-CM | POA: Diagnosis not present

## 2024-04-06 DIAGNOSIS — R351 Nocturia: Secondary | ICD-10-CM | POA: Diagnosis not present

## 2024-04-08 DIAGNOSIS — R310 Gross hematuria: Secondary | ICD-10-CM | POA: Diagnosis not present

## 2024-04-26 DIAGNOSIS — R310 Gross hematuria: Secondary | ICD-10-CM | POA: Diagnosis not present

## 2024-04-29 DIAGNOSIS — R319 Hematuria, unspecified: Secondary | ICD-10-CM | POA: Diagnosis not present

## 2024-04-29 DIAGNOSIS — R309 Painful micturition, unspecified: Secondary | ICD-10-CM | POA: Diagnosis not present

## 2024-04-29 DIAGNOSIS — R1032 Left lower quadrant pain: Secondary | ICD-10-CM | POA: Diagnosis not present

## 2024-05-05 ENCOUNTER — Other Ambulatory Visit: Payer: Self-pay | Admitting: Urology

## 2024-05-05 DIAGNOSIS — K7689 Other specified diseases of liver: Secondary | ICD-10-CM

## 2024-05-18 DIAGNOSIS — N401 Enlarged prostate with lower urinary tract symptoms: Secondary | ICD-10-CM | POA: Diagnosis not present

## 2024-05-18 DIAGNOSIS — R310 Gross hematuria: Secondary | ICD-10-CM | POA: Diagnosis not present

## 2024-05-18 DIAGNOSIS — R350 Frequency of micturition: Secondary | ICD-10-CM | POA: Diagnosis not present

## 2024-05-18 DIAGNOSIS — R972 Elevated prostate specific antigen [PSA]: Secondary | ICD-10-CM | POA: Diagnosis not present

## 2024-05-29 DIAGNOSIS — R58 Hemorrhage, not elsewhere classified: Secondary | ICD-10-CM | POA: Diagnosis not present

## 2024-05-29 DIAGNOSIS — M7989 Other specified soft tissue disorders: Secondary | ICD-10-CM | POA: Diagnosis not present

## 2024-05-29 DIAGNOSIS — S4992XA Unspecified injury of left shoulder and upper arm, initial encounter: Secondary | ICD-10-CM | POA: Diagnosis not present

## 2024-05-29 DIAGNOSIS — S32401A Unspecified fracture of right acetabulum, initial encounter for closed fracture: Secondary | ICD-10-CM | POA: Diagnosis not present

## 2024-05-29 DIAGNOSIS — S32424A Nondisplaced fracture of posterior wall of right acetabulum, initial encounter for closed fracture: Secondary | ICD-10-CM | POA: Diagnosis not present

## 2024-05-29 DIAGNOSIS — M25559 Pain in unspecified hip: Secondary | ICD-10-CM | POA: Diagnosis not present

## 2024-06-01 ENCOUNTER — Other Ambulatory Visit

## 2024-06-01 DIAGNOSIS — S32401A Unspecified fracture of right acetabulum, initial encounter for closed fracture: Secondary | ICD-10-CM | POA: Diagnosis not present

## 2024-06-06 DIAGNOSIS — M25551 Pain in right hip: Secondary | ICD-10-CM | POA: Diagnosis not present

## 2024-06-06 DIAGNOSIS — M25522 Pain in left elbow: Secondary | ICD-10-CM | POA: Diagnosis not present

## 2024-06-16 DIAGNOSIS — M25522 Pain in left elbow: Secondary | ICD-10-CM | POA: Diagnosis not present

## 2024-06-24 DIAGNOSIS — S32491A Other specified fracture of right acetabulum, initial encounter for closed fracture: Secondary | ICD-10-CM | POA: Diagnosis not present

## 2024-06-24 DIAGNOSIS — S5002XD Contusion of left elbow, subsequent encounter: Secondary | ICD-10-CM | POA: Diagnosis not present

## 2024-07-05 DIAGNOSIS — S32491A Other specified fracture of right acetabulum, initial encounter for closed fracture: Secondary | ICD-10-CM | POA: Diagnosis not present

## 2024-07-23 ENCOUNTER — Emergency Department (HOSPITAL_COMMUNITY)
Admission: EM | Admit: 2024-07-23 | Discharge: 2024-07-23 | Disposition: A | Discharge status: Home / Self Care | Attending: Emergency Medicine | Admitting: Emergency Medicine

## 2024-07-23 ENCOUNTER — Emergency Department (HOSPITAL_COMMUNITY)

## 2024-07-23 ENCOUNTER — Encounter (HOSPITAL_COMMUNITY)
Admission: EM | Disposition: A | Discharge status: Home / Self Care | Payer: Self-pay | Source: Home / Self Care | Attending: Emergency Medicine

## 2024-07-23 ENCOUNTER — Encounter (HOSPITAL_COMMUNITY): Payer: Self-pay

## 2024-07-23 DIAGNOSIS — I959 Hypotension, unspecified: Secondary | ICD-10-CM | POA: Diagnosis not present

## 2024-07-23 DIAGNOSIS — I10 Essential (primary) hypertension: Secondary | ICD-10-CM | POA: Insufficient documentation

## 2024-07-23 DIAGNOSIS — R55 Syncope and collapse: Secondary | ICD-10-CM | POA: Insufficient documentation

## 2024-07-23 DIAGNOSIS — R531 Weakness: Secondary | ICD-10-CM | POA: Diagnosis not present

## 2024-07-23 DIAGNOSIS — Z79899 Other long term (current) drug therapy: Secondary | ICD-10-CM | POA: Insufficient documentation

## 2024-07-23 DIAGNOSIS — E861 Hypovolemia: Secondary | ICD-10-CM | POA: Diagnosis not present

## 2024-07-23 DIAGNOSIS — R61 Generalized hyperhidrosis: Secondary | ICD-10-CM | POA: Diagnosis not present

## 2024-07-23 DIAGNOSIS — R9431 Abnormal electrocardiogram [ECG] [EKG]: Secondary | ICD-10-CM | POA: Diagnosis not present

## 2024-07-23 DIAGNOSIS — R42 Dizziness and giddiness: Secondary | ICD-10-CM | POA: Diagnosis not present

## 2024-07-23 LAB — CBC WITH DIFFERENTIAL/PLATELET
Abs Immature Granulocytes: 0.03 K/uL (ref 0.00–0.07)
Basophils Absolute: 0 K/uL (ref 0.0–0.1)
Basophils Relative: 1 %
Eosinophils Absolute: 0.1 K/uL (ref 0.0–0.5)
Eosinophils Relative: 1 %
HCT: 43.4 % (ref 39.0–52.0)
Hemoglobin: 14.4 g/dL (ref 13.0–17.0)
Immature Granulocytes: 1 %
Lymphocytes Relative: 21 %
Lymphs Abs: 1.3 K/uL (ref 0.7–4.0)
MCH: 29.5 pg (ref 26.0–34.0)
MCHC: 33.2 g/dL (ref 30.0–36.0)
MCV: 88.9 fL (ref 80.0–100.0)
Monocytes Absolute: 0.4 K/uL (ref 0.1–1.0)
Monocytes Relative: 6 %
Neutro Abs: 4.5 K/uL (ref 1.7–7.7)
Neutrophils Relative %: 70 %
Platelets: 194 K/uL (ref 150–400)
RBC: 4.88 MIL/uL (ref 4.22–5.81)
RDW: 12.8 % (ref 11.5–15.5)
WBC: 6.3 K/uL (ref 4.0–10.5)
nRBC: 0 % (ref 0.0–0.2)

## 2024-07-23 LAB — COMPREHENSIVE METABOLIC PANEL WITH GFR
ALT: 33 U/L (ref 0–44)
AST: 24 U/L (ref 15–41)
Albumin: 3.7 g/dL (ref 3.5–5.0)
Alkaline Phosphatase: 51 U/L (ref 38–126)
Anion gap: 10 (ref 5–15)
BUN: 24 mg/dL — ABNORMAL HIGH (ref 6–20)
CO2: 21 mmol/L — ABNORMAL LOW (ref 22–32)
Calcium: 9.2 mg/dL (ref 8.9–10.3)
Chloride: 107 mmol/L (ref 98–111)
Creatinine, Ser: 1.23 mg/dL (ref 0.61–1.24)
GFR, Estimated: >60 mL/min (ref >60)
Glucose, Bld: 97 mg/dL (ref 70–99)
Potassium: 4.7 mmol/L (ref 3.5–5.1)
Sodium: 138 mmol/L (ref 135–145)
Total Bilirubin: 0.8 mg/dL (ref 0.0–1.2)
Total Protein: 6.5 g/dL (ref 6.5–8.1)

## 2024-07-23 LAB — TROPONIN I (HIGH SENSITIVITY): Troponin I (High Sensitivity): 4 ng/L (ref <18)

## 2024-07-23 SURGERY — CORONARY/GRAFT ACUTE MI REVASCULARIZATION
Anesthesia: LOCAL

## 2024-07-23 MED ORDER — LACTATED RINGERS IV BOLUS
500.0000 mL | Freq: Once | INTRAVENOUS | Status: AC
  Administered 2024-07-23: 500 mL via INTRAVENOUS

## 2024-07-23 NOTE — ED Provider Notes (Signed)
 [place] EMERGENCY DEPARTMENT AT [facility] Provider Note   CSN: [id number] Arrival date & time: 07/23/24  1307     Patient presents with: Near Syncope   Ronnie Wood is a 58 y.o. male.   Patient is a 58 year old male with a history of hypertension, hyperlipidemia, GERD, BPH with recurrent hematuria who is presenting today initially as a code STEMI.  It was reported that patient was at the football game grilling when he started feeling weak, dizzy and diaphoretic.  Prior to symptoms starting he did note he had not had anything to eat today and had drank 1 beer and was starting to eat a slider when he developed symptoms.  He went and sat down for 30-45 min without resolution of sx.  When EMS arrived patient was still looking unwell with similar complaints and was noted to be hypotensive at 78/46.  He was given 500 mL of saline with persistent blood pressures in the 90s.  EMS did EKGs that showed ST elevation in V2 that seem to progress with subsequent EKGs.  They called in as a code STEMI.  Patient currently is denying any chest pain, he is reading the newspaper and appears comfortable.  No further diaphoresis.  He denies any shortness of breath or palpitations during the event.  No recent medications changes but did take 1 of his wife's phentermine yesterday.  The history is provided by the patient and the EMS personnel.  Near Syncope       Prior to Admission medications   Medication Sig Start Date End Date Taking? Authorizing Provider  allopurinol (ZYLOPRIM) 300 MG tablet Take 300 mg by mouth daily. 06/22/19   [provider]  amLODipine (NORVASC) 10 MG tablet Take 10 mg by mouth daily.     [provider]  amoxicillin (AMOXIL) 500 MG tablet Take 500 mg by mouth 3 (three) times daily. 09/01/19   [provider]  atorvastatin (LIPITOR) 10 MG tablet Take 10 mg by mouth daily. 04/14/19   [provider]  losartan (COZAAR) 100 MG tablet Take  100 mg by mouth daily. 08/30/19   [provider]  meloxicam (MOBIC) 15 MG tablet Take 15 mg by mouth 3 (three) times daily. 11/10/19   [provider]  metoprolol  tartrate (LOPRESSOR ) 50 MG tablet Take 2 tabs(100 mg ) once 2 hours prior to CT if heart rate greater than 55 09/13/19   Tobb, Kardie, DO  nitroGLYCERIN  (NITROSTAT ) 0.4 MG SL tablet Place 1 tablet (0.4 mg total) under the tongue every 5 (five) minutes as needed. 09/13/19 12/12/19  Tobb, Kardie, DO  tamsulosin (FLOMAX) 0.4 MG CAPS capsule Take 0.4 mg by mouth daily. 11/02/19   [provider]    Allergies: Sulfa antibiotics    Review of Systems  Cardiovascular:  Positive for near-syncope.    Updated Vital Signs BP 107/74   Pulse 72   Temp 97.6 F (36.4 C) (Oral)   Resp 19   Ht 5' 8.4 (1.737 m)   Wt 78 kg   SpO2 100%   BMI 25.85 kg/m   Physical Exam Vitals and nursing note reviewed.  Constitutional:      General: He is not in acute distress.    Appearance: He is well-developed.  HENT:     Head: Normocephalic and atraumatic.  Eyes:     Conjunctiva/sclera: Conjunctivae normal.     Pupils: Pupils are equal, round, and reactive to light.  Cardiovascular:     Rate and  Rhythm: Regular rhythm. Bradycardia present.     Pulses: Normal pulses.     Heart sounds: No murmur heard. Pulmonary:     Effort: Pulmonary effort is normal. No respiratory distress.     Breath sounds: Normal breath sounds. No wheezing or rales.  Abdominal:     General: There is no distension.     Palpations: Abdomen is soft.     Tenderness: There is no abdominal tenderness. There is no guarding or rebound.  Musculoskeletal:        General: No tenderness. Normal range of motion.     Cervical back: Normal range of motion and neck supple.     Right lower leg: No edema.     Left lower leg: No edema.  Skin:    General: Skin is warm and dry.     Findings: No erythema or rash.  Neurological:     Mental Status: He is alert and  oriented to person, place, and time. Mental status is at baseline.  Psychiatric:        Behavior: Behavior normal.     (all labs ordered are listed, but only abnormal results are displayed) Labs Reviewed  COMPREHENSIVE METABOLIC PANEL WITH GFR - Abnormal; Notable for the following components:      Result Value   CO2 21 (*)    BUN 24 (*)    All other components within normal limits  CBC WITH DIFFERENTIAL/PLATELET  TROPONIN I (HIGH SENSITIVITY)  TROPONIN I (HIGH SENSITIVITY)    EKG: EKG Interpretation Date/Time:  Saturday July 23 2024 13:19:57 EST Ventricular Rate:  54 PR Interval:  139 QRS Duration:  81 QT Interval:  380 QTC Calculation: 360 R Axis:   56  Text Interpretation: Sinus rhythm Borderline T abnormalities, diffuse leads No significant change since last tracing Confirmed by Doretha Folks ([id number]) on 07/23/2024 1:31:26 PM  Radiology: [facility] Chest Port 1 View Result Date: 07/23/2024 CLINICAL DATA:  near syncope EXAM: PORTABLE CHEST 1 VIEW COMPARISON:  September 05, 2018, May 29, 2024 FINDINGS: Evaluation is limited by overlapping material. The cardiomediastinal silhouette is unchanged in contour.Low lung volume radiograph. LEFT retrocardiac opacity with an obscured heart border. No pleural effusion. No pneumothorax. Status post ACDF. IMPRESSION: LEFT retrocardiac opacity with an obscured heart border. Differential considerations include atelectasis, aspiration or infection. Recommend repeat PA and lateral chest radiograph in 4-6 weeks to assess for resolution. Electronically Signed   By: Corean Salter M.D.   On: 07/23/2024 14:15     Procedures   Medications Ordered in the ED  lactated ringers bolus 500 mL (0 mLs Intravenous Stopped 07/23/24 1656)                                    Medical Decision Making Amount and/or Complexity of Data Reviewed External Data Reviewed: notes. Labs: ordered. Decision-making details documented in ED  Course. Radiology: ordered and independent interpretation performed. Decision-making details documented in ED Course. ECG/medicine tests: ordered and independent interpretation performed. Decision-making details documented in ED Course.   Pt with multiple medical problems and comorbidities and presenting today with a complaint that caries a high risk for morbidity and mortality.  Here today with a near syncopal event and initially as a code STEMI.  Upon arrival here patient is asymptomatic.  Blood pressure is improving.  Repeat EKG shows no evidence of STEMI and EKGs by EMS do show ST elevation in V2 but looks  like it might be more chronic from prolonged hypertension.  Looking back at prior EKGs looks similar to the past and code STEMI was canceled.  I independently interpreted patient's EKG here and it is unchanged from EKG in 2011. 6:37 PM I independently interpreted patient's labs and CMP without acute findings, troponin is normal, CBC within normal limits without evidence of anemia.  I have independently visualized and interpreted pt's images today. Chest x-ray without acute findings however radiology reports left retrocardiac opacity with obscured heart border which could be from atelectasis aspiration or infection and recommended a repeat x-ray in approximately 4 to 6 weeks.  Patient is having no respiratory symptoms at this time and no need for repeat imaging at this time.  After IV fluids patient's blood pressure has been normal now for several hours.  He is feeling well.  At this time feel that patient can be discharged home.  Did caution him taking his blood pressure medication if his blood pressure remained on the low normal side.  He does report recently that he is cut out all sugars and has been having a much cleaner diet and healthy lifestyle.  May not need this much blood pressure medication.  Encouraged him to follow-up with his PCP to help manage his medications.  He and his wife are  comfortable with this plan.  He was discharged in good condition.  No indication for admission at this time.      Final diagnoses:  Near syncope  Hypotension due to hypovolemia    ED Discharge Orders     None          Doretha Folks, MD 07/23/24 [phone]

## 2024-07-23 NOTE — ED Triage Notes (Signed)
 BIB EMS r/t Near syncope x1 today while at football game. EMS found patient weak, dizzy, diaphoretic, and hypotensive (78/46). EMS administered 500ml NS with BP improvement and 324 ASA. Denies CP/SOB/LOC. A&Ox4.  EKG showed ST elevation en route but during triage cardiology cleared patient.

## 2024-07-23 NOTE — Discharge Instructions (Addendum)
 Hold your amlodipine if you check your blood pressure tomorrow and it is 110/70 or less.  Make sure you are eating and drinking regularly and staying hydrated.  Also on your x-ray today they saw a small shadow which is most likely nothing but recommended you get a repeat x-ray of your chest done in approximately 4 to 6 weeks but your doctor can order.  All your blood work today looked normal.  There was no sign of any issues with your kidneys, no signs of heart attack and your blood counts were good.  If you start having recurrent episodes of feeling lightheaded or if you pass out or develop racing heart, chest pain or shortness of breath you should return to the emergency room

## 2024-08-02 DIAGNOSIS — S32491D Other specified fracture of right acetabulum, subsequent encounter for fracture with routine healing: Secondary | ICD-10-CM | POA: Diagnosis not present

## 2024-08-10 DIAGNOSIS — R972 Elevated prostate specific antigen [PSA]: Secondary | ICD-10-CM | POA: Diagnosis not present

## 2024-08-17 DIAGNOSIS — R972 Elevated prostate specific antigen [PSA]: Secondary | ICD-10-CM | POA: Diagnosis not present

## 2024-08-17 DIAGNOSIS — N401 Enlarged prostate with lower urinary tract symptoms: Secondary | ICD-10-CM | POA: Diagnosis not present

## 2024-08-17 DIAGNOSIS — R3912 Poor urinary stream: Secondary | ICD-10-CM | POA: Diagnosis not present

## 2024-08-17 DIAGNOSIS — R351 Nocturia: Secondary | ICD-10-CM | POA: Diagnosis not present

## 2024-08-17 DIAGNOSIS — R350 Frequency of micturition: Secondary | ICD-10-CM | POA: Diagnosis not present

## 2024-08-22 ENCOUNTER — Other Ambulatory Visit

## 2024-09-15 ENCOUNTER — Other Ambulatory Visit
